# Patient Record
Sex: Female | Born: 1970 | Race: Black or African American | Hispanic: No | Marital: Married | Smoking: Never smoker
Health system: Southern US, Community
[De-identification: ages and names within clinical notes are randomized; demographics above are authoritative.]

## PROBLEM LIST (undated history)

## (undated) DIAGNOSIS — F329 Major depressive disorder, single episode, unspecified: Secondary | ICD-10-CM

## (undated) DIAGNOSIS — I639 Cerebral infarction, unspecified: Secondary | ICD-10-CM

## (undated) DIAGNOSIS — M1990 Unspecified osteoarthritis, unspecified site: Secondary | ICD-10-CM

## (undated) DIAGNOSIS — N189 Chronic kidney disease, unspecified: Secondary | ICD-10-CM

## (undated) DIAGNOSIS — Z87442 Personal history of urinary calculi: Secondary | ICD-10-CM

## (undated) DIAGNOSIS — E119 Type 2 diabetes mellitus without complications: Secondary | ICD-10-CM

## (undated) DIAGNOSIS — J939 Pneumothorax, unspecified: Secondary | ICD-10-CM

## (undated) DIAGNOSIS — D649 Anemia, unspecified: Secondary | ICD-10-CM

## (undated) DIAGNOSIS — I10 Essential (primary) hypertension: Secondary | ICD-10-CM

## (undated) DIAGNOSIS — F32A Depression, unspecified: Secondary | ICD-10-CM

## (undated) DIAGNOSIS — R011 Cardiac murmur, unspecified: Secondary | ICD-10-CM

## (undated) HISTORY — PX: KIDNEY STONE SURGERY: SHX686

## (undated) HISTORY — PX: CHEST TUBE INSERTION: SHX231

## (undated) HISTORY — PX: CARDIAC SURGERY: SHX584

## (undated) HISTORY — PX: EYE SURGERY: SHX253

## (undated) HISTORY — PX: HYSTERECTOMY ABDOMINAL WITH SALPINGECTOMY: SHX6725

## (undated) HISTORY — PX: WISDOM TOOTH EXTRACTION: SHX21

## (undated) HISTORY — PX: PERICARDIAL WINDOW: SHX2213

---

## 2015-12-19 ENCOUNTER — Ambulatory Visit (HOSPITAL_COMMUNITY)
Admission: EM | Admit: 2015-12-19 | Discharge: 2015-12-19 | Disposition: A | Discharge status: Nursing Home (Includes ICF) | Payer: BC Managed Care – PPO | Attending: Emergency Medicine | Admitting: Emergency Medicine

## 2015-12-19 ENCOUNTER — Emergency Department (HOSPITAL_COMMUNITY)
Admission: EM | Admit: 2015-12-19 | Discharge: 2015-12-19 | Disposition: A | Discharge status: Home / Self Care | Payer: BC Managed Care – PPO

## 2015-12-19 ENCOUNTER — Encounter (HOSPITAL_COMMUNITY): Payer: Self-pay | Admitting: Emergency Medicine

## 2015-12-19 DIAGNOSIS — M545 Low back pain, unspecified: Secondary | ICD-10-CM

## 2015-12-19 DIAGNOSIS — R319 Hematuria, unspecified: Secondary | ICD-10-CM | POA: Diagnosis not present

## 2015-12-19 HISTORY — DX: Essential (primary) hypertension: I10

## 2015-12-19 HISTORY — DX: Cerebral infarction, unspecified: I63.9

## 2015-12-19 HISTORY — DX: Type 2 diabetes mellitus without complications: E11.9

## 2015-12-19 LAB — POCT URINALYSIS DIP (DEVICE)
Bilirubin Urine: NEGATIVE
GLUCOSE, UA: NEGATIVE mg/dL
KETONES UR: NEGATIVE mg/dL
Nitrite: NEGATIVE
PH: 7.5 (ref 5.0–8.0)
Specific Gravity, Urine: 1.025 (ref 1.005–1.030)
UROBILINOGEN UA: 0.2 mg/dL (ref 0.0–1.0)

## 2015-12-19 LAB — POCT PREGNANCY, URINE: PREG TEST UR: NEGATIVE

## 2015-12-19 MED ORDER — MORPHINE SULFATE (PF) 2 MG/ML IV SOLN
2.0000 mg | Freq: Once | INTRAVENOUS | Status: AC
  Administered 2015-12-19: 2 mg via INTRAMUSCULAR

## 2015-12-19 MED ORDER — KETOROLAC TROMETHAMINE 60 MG/2ML IM SOLN
60.0000 mg | Freq: Once | INTRAMUSCULAR | Status: AC
  Administered 2015-12-19: 60 mg via INTRAMUSCULAR

## 2015-12-19 MED ORDER — MORPHINE SULFATE (PF) 2 MG/ML IV SOLN
INTRAVENOUS | Status: AC
  Filled 2015-12-19: qty 1

## 2015-12-19 MED ORDER — KETOROLAC TROMETHAMINE 60 MG/2ML IM SOLN
INTRAMUSCULAR | Status: AC
  Filled 2015-12-19: qty 2

## 2015-12-19 NOTE — ED Notes (Signed)
Went to get urine specimen, patient stated that she went while in the waiting room before coming back, patient is unable to void at this time

## 2015-12-19 NOTE — ED Triage Notes (Signed)
Pt called for triage X 2 with no answer

## 2015-12-19 NOTE — ED Provider Notes (Addendum)
HPI  SUBJECTIVE:  Felicia Guerrero is a 45 y.o. female who presents with the acute onset of right low back pain starting today. States it is throbbing, constant, and states that she "can't get comfortable". States this started around 1500 today. She reports nausea, vomiting, urinary frequency and odorous urine. She tried a Norco at 1600. There are no other aggravating or alleviating factors. She has not tried anything else for this. She denies fevers, dysuria, urgency, cloudy urine or hematuria. No back swelling. Patient states that she feels like she is able to empty her bladder completely. No abdominal pain, pelvic pain. No vaginal complaints. She has taken an antipyretic in the past 6-8 hours. She states this is identical in quality and location of previous nephrolithiasis. She has a past medical history of obstructive nephrolithiasis status post multiple surgeries with stents, nephrostomy tubes, kidney blood clots. 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 She took her blood pressure medicines today. LMP: Status post hysterectomy. Urology: [facility]one [facility] [place].     Past Medical History:  Diagnosis Date  . Diabetes mellitus without complication ([place])   . Hypertension   . Kidney stones   . Stroke [facility])     Past Surgical History:  Procedure Laterality Date  . HYSTERECTOMY ABDOMINAL WITH SALPINGECTOMY    . KIDNEY STONE SURGERY      History reviewed. No pertinent family history.  Social History  Substance Use Topics  . Smoking status: Never Smoker  . Smokeless tobacco: Never Used  . Alcohol use No    No current facility-administered medications for this encounter.   Current Outpatient Prescriptions:  .  aspirin 81 MG chewable tablet, Chew by mouth daily., Disp: , Rfl:  .  Cod Liver Oil 10 MINIM CAPS, Take by mouth., Disp: , Rfl:  .  ferrous sulfate 325 (65 FE) MG tablet, Take 325 mg by mouth  daily with breakfast., Disp: , Rfl:  .  LISINOPRIL PO, Take by mouth., Disp: , Rfl:  .  MULTIPLE VITAMIN PO, Take by mouth., Disp: , Rfl:  .  NIFEdipine (PROCARDIA) 10 MG capsule, Take 10 mg by mouth 3 (three) times daily., Disp: , Rfl:  .  sitaGLIPtin-metformin (JANUMET) 50-500 MG tablet, Take 1 tablet by mouth 2 (two) times daily with a meal., Disp: , Rfl:   No Known Allergies   ROS  As noted in HPI.   Physical Exam  BP (!) 212/86 (BP Location: Left Arm)   Pulse 75   Temp 98.7 F (37.1 C) (Oral)   Resp 20   SpO2 100%   Constitutional: Well developed, well nourished, Appears to be in moderate painful distress Eyes:  EOMI, conjunctiva normal bilaterally HENT: Normocephalic, atraumatic,mucus membranes moist Respiratory: Normal inspiratory effort Cardiovascular: Normal rate GI: nondistended, soft, nontender. No suprapubic or flank tenderness Back: Positive right CVA tenderness. Multiple surgical scars noted. skin: No rash, skin intact Musculoskeletal: no deformities Neurologic: Alert & oriented x 3, no focal neuro deficits Psychiatric: Speech and behavior appropriate   ED Course   Medications  ketorolac (TORADOL) injection 60 mg (60 mg Intramuscular Given 12/19/15 2000)  morphine 2 MG/ML injection 2 mg (2 mg Intramuscular Given 12/19/15 2000)    Orders Placed This Encounter  Procedures  . POCT urinalysis dip (device)    Standing Status:   Standing    Number of Occurrences:   1  . Pregnancy, urine POC    Standing Status:  Standing    Number of Occurrences:   1    Results for orders placed or performed during the hospital encounter of 12/19/15 (from the past 24 hour(s))  POCT urinalysis dip (device)     Status: Abnormal   Collection Time: 12/19/15  7:17 PM  Result Value Ref Range   Glucose, UA NEGATIVE NEGATIVE mg/dL   Bilirubin Urine NEGATIVE NEGATIVE   Ketones, ur NEGATIVE NEGATIVE mg/dL   Specific Gravity, Urine 1.025 1.005 - 1.030   Hgb urine dipstick  MODERATE (A) NEGATIVE   pH 7.5 5.0 - 8.0   Protein, ur >=300 (A) NEGATIVE mg/dL   Urobilinogen, UA 0.2 0.0 - 1.0 mg/dL   Nitrite NEGATIVE NEGATIVE   Leukocytes, UA TRACE (A) NEGATIVE  Pregnancy, urine POC     Status: None   Collection Time: 12/19/15  7:23 PM  Result Value Ref Range   Preg Test, Ur NEGATIVE NEGATIVE   No results found.  ED Clinical Impression  Hematuria, unspecified type  Acute right-sided low back pain without sciatica  ED Assessment/Plan  Presentation most consistent with recurrent kidney stone. UA with moderate hematuria and proteinuria. She appears to be in moderate painful distress, gave Toradol 60 mg IM and morphine 2 mg IM. Patient took a Norco at 1600. She declined an antiemetic. States that this usually makes her worse. Plan to send down to the ED to rule out a recurrent obstructing stone given the fact that she's had multiple history of obstructing stones with subsequent complications.   Patient's blood pressure is noted, most likely is due to the pain. She denies chest pain shortness of breath or strokelike symptoms. Doubt hypertensive emergency.   Feel that she is stable to go via shuttle. Discussed rationale for transfer with patient. She agrees with plan.   Meds ordered this encounter  Medications  . LISINOPRIL PO    Sig: Take by mouth.  Marland Kitchen NIFEdipine (PROCARDIA) 10 MG capsule    Sig: Take 10 mg by mouth 3 (three) times daily.  . sitaGLIPtin-metformin (JANUMET) 50-500 MG tablet    Sig: Take 1 tablet by mouth 2 (two) times daily with a meal.  . ferrous sulfate 325 (65 FE) MG tablet    Sig: Take 325 mg by mouth daily with breakfast.  . aspirin 81 MG chewable tablet    Sig: Chew by mouth daily.  . MULTIPLE VITAMIN PO    Sig: Take by mouth.  Marland Kitchen Cod Liver Oil 10 MINIM CAPS    Sig: Take by mouth.  Marland Kitchen ketorolac (TORADOL) injection 60 mg  . morphine 2 MG/ML injection 2 mg    *This clinic note was created using [employer]. Therefore,  there may be occasional mistakes despite careful proofreading.  ?   Melynda Ripple, MD 12/19/15 [place], MD 12/19/15 2100

## 2015-12-19 NOTE — Discharge Instructions (Signed)
Go immediately to the emergency room. Let them know if you start having chest pain, severe headache, visual changes, any other strokelike symptoms, or worsening of your back pain.

## 2015-12-19 NOTE — ED Triage Notes (Signed)
The patient presented to the [facility] with a complaint of right side flank pain that started today. The patient also reported urinary frequency. The patient has an extensive hx of kidney stones. The patient stated that she contacted her Urologist who advised her to come to an Urgent [place] if the pain increased.

## 2017-02-15 ENCOUNTER — Encounter (HOSPITAL_COMMUNITY): Payer: Self-pay | Admitting: Emergency Medicine

## 2017-02-15 ENCOUNTER — Encounter (HOSPITAL_COMMUNITY)
Admission: EM | Disposition: A | Discharge status: Home / Self Care | Payer: Self-pay | Source: Home / Self Care | Attending: Urology

## 2017-02-15 ENCOUNTER — Emergency Department (HOSPITAL_COMMUNITY): Payer: Medicaid Other | Admitting: Registered Nurse

## 2017-02-15 ENCOUNTER — Emergency Department (HOSPITAL_COMMUNITY): Payer: Medicaid Other

## 2017-02-15 ENCOUNTER — Inpatient Hospital Stay (HOSPITAL_COMMUNITY): Payer: Medicaid Other

## 2017-02-15 ENCOUNTER — Inpatient Hospital Stay (HOSPITAL_COMMUNITY)
Admission: EM | Admit: 2017-02-15 | Discharge: 2017-02-17 | DRG: 854 | Disposition: A | Discharge status: Home / Self Care | Payer: Medicaid Other | Attending: Urology | Admitting: Urology

## 2017-02-15 DIAGNOSIS — E1122 Type 2 diabetes mellitus with diabetic chronic kidney disease: Secondary | ICD-10-CM | POA: Diagnosis present

## 2017-02-15 DIAGNOSIS — A4151 Sepsis due to Escherichia coli [E. coli]: Secondary | ICD-10-CM | POA: Diagnosis present

## 2017-02-15 DIAGNOSIS — N12 Tubulo-interstitial nephritis, not specified as acute or chronic: Secondary | ICD-10-CM | POA: Diagnosis present

## 2017-02-15 DIAGNOSIS — N179 Acute kidney failure, unspecified: Secondary | ICD-10-CM | POA: Diagnosis present

## 2017-02-15 DIAGNOSIS — Z7982 Long term (current) use of aspirin: Secondary | ICD-10-CM | POA: Diagnosis not present

## 2017-02-15 DIAGNOSIS — I129 Hypertensive chronic kidney disease with stage 1 through stage 4 chronic kidney disease, or unspecified chronic kidney disease: Secondary | ICD-10-CM | POA: Diagnosis present

## 2017-02-15 DIAGNOSIS — Z7984 Long term (current) use of oral hypoglycemic drugs: Secondary | ICD-10-CM

## 2017-02-15 DIAGNOSIS — R6520 Severe sepsis without septic shock: Secondary | ICD-10-CM | POA: Diagnosis present

## 2017-02-15 DIAGNOSIS — N289 Disorder of kidney and ureter, unspecified: Secondary | ICD-10-CM

## 2017-02-15 DIAGNOSIS — N136 Pyonephrosis: Secondary | ICD-10-CM | POA: Diagnosis present

## 2017-02-15 DIAGNOSIS — N201 Calculus of ureter: Secondary | ICD-10-CM | POA: Diagnosis present

## 2017-02-15 DIAGNOSIS — Z87442 Personal history of urinary calculi: Secondary | ICD-10-CM | POA: Diagnosis not present

## 2017-02-15 DIAGNOSIS — I69398 Other sequelae of cerebral infarction: Secondary | ICD-10-CM | POA: Diagnosis not present

## 2017-02-15 DIAGNOSIS — N189 Chronic kidney disease, unspecified: Secondary | ICD-10-CM | POA: Diagnosis present

## 2017-02-15 DIAGNOSIS — A419 Sepsis, unspecified organism: Secondary | ICD-10-CM

## 2017-02-15 HISTORY — PX: CYSTOSCOPY W/ URETERAL STENT PLACEMENT: SHX1429

## 2017-02-15 LAB — URINALYSIS, ROUTINE W REFLEX MICROSCOPIC
Bilirubin Urine: NEGATIVE
GLUCOSE, UA: 50 mg/dL — AB
KETONES UR: 5 mg/dL — AB
NITRITE: NEGATIVE
PH: 5 (ref 5.0–8.0)
Specific Gravity, Urine: 1.029 (ref 1.005–1.030)

## 2017-02-15 LAB — I-STAT BETA HCG BLOOD, ED (MC, WL, AP ONLY): I-stat hCG, quantitative: <5 m[IU]/mL (ref <5)

## 2017-02-15 LAB — LACTIC ACID, PLASMA
LACTIC ACID, VENOUS: 4.4 mmol/L — AB (ref 0.5–1.9)
LACTIC ACID, VENOUS: 3.2 mmol/L — AB (ref 0.5–1.9)

## 2017-02-15 LAB — CBC
HEMATOCRIT: 33.4 % — AB (ref 36.0–46.0)
HEMOGLOBIN: 11.4 g/dL — AB (ref 12.0–15.0)
MCH: 29.4 pg (ref 26.0–34.0)
MCHC: 34.1 g/dL (ref 30.0–36.0)
MCV: 86.1 fL (ref 78.0–100.0)
Platelets: 238 10*3/uL (ref 150–400)
RBC: 3.88 MIL/uL (ref 3.87–5.11)
RDW: 13.3 % (ref 11.5–15.5)
WBC: 19.7 10*3/uL — AB (ref 4.0–10.5)

## 2017-02-15 LAB — COMPREHENSIVE METABOLIC PANEL
ALBUMIN: 3 g/dL — AB (ref 3.5–5.0)
ALT: 14 U/L (ref 14–54)
AST: 23 U/L (ref 15–41)
Alkaline Phosphatase: 63 U/L (ref 38–126)
Anion gap: 15 (ref 5–15)
BUN: 30 mg/dL — AB (ref 6–20)
CHLORIDE: 95 mmol/L — AB (ref 101–111)
CO2: 22 mmol/L (ref 22–32)
Calcium: 8.3 mg/dL — ABNORMAL LOW (ref 8.9–10.3)
Creatinine, Ser: 2.64 mg/dL — ABNORMAL HIGH (ref 0.44–1.00)
GFR calc Af Amer: 24 mL/min — ABNORMAL LOW (ref >60)
GFR, EST NON AFRICAN AMERICAN: 20 mL/min — AB (ref >60)
Glucose, Bld: 295 mg/dL — ABNORMAL HIGH (ref 65–99)
POTASSIUM: 3.7 mmol/L (ref 3.5–5.1)
Sodium: 132 mmol/L — ABNORMAL LOW (ref 135–145)
Total Bilirubin: 1 mg/dL (ref 0.3–1.2)
Total Protein: 6.5 g/dL (ref 6.5–8.1)

## 2017-02-15 LAB — GLUCOSE, CAPILLARY
GLUCOSE-CAPILLARY: 175 mg/dL — AB (ref 65–99)
Glucose-Capillary: 190 mg/dL — ABNORMAL HIGH (ref 65–99)

## 2017-02-15 LAB — I-STAT CG4 LACTIC ACID, ED
LACTIC ACID, VENOUS: 2.88 mmol/L — AB (ref 0.5–1.9)
Lactic Acid, Venous: 2.17 mmol/L (ref 0.5–1.9)

## 2017-02-15 LAB — LIPASE, BLOOD: LIPASE: 21 U/L (ref 11–51)

## 2017-02-15 SURGERY — CYSTOSCOPY, WITH RETROGRADE PYELOGRAM AND URETERAL STENT INSERTION
Anesthesia: General | Site: Ureter | Laterality: Left

## 2017-02-15 MED ORDER — SODIUM CHLORIDE 0.9 % IR SOLN
Status: DC | PRN
  Administered 2017-02-15: 3000 mL

## 2017-02-15 MED ORDER — FENTANYL CITRATE (PF) 100 MCG/2ML IJ SOLN
INTRAMUSCULAR | Status: DC | PRN
  Administered 2017-02-15: 100 ug via INTRAVENOUS
  Administered 2017-02-15: 50 ug via INTRAVENOUS

## 2017-02-15 MED ORDER — NIFEDIPINE ER OSMOTIC RELEASE 30 MG PO TB24
90.0000 mg | ORAL_TABLET | Freq: Every day | ORAL | Status: DC
  Administered 2017-02-16: 90 mg via ORAL
  Filled 2017-02-15 (×5): qty 1

## 2017-02-15 MED ORDER — CEFTRIAXONE SODIUM 2 G IJ SOLR
2.0000 g | Freq: Once | INTRAMUSCULAR | Status: AC
  Administered 2017-02-15: 2 g via INTRAVENOUS
  Filled 2017-02-15: qty 2

## 2017-02-15 MED ORDER — HYDROXYZINE HCL 25 MG PO TABS: 25.0000 mg | ORAL_TABLET | Freq: Every day | ORAL | Status: DC | PRN

## 2017-02-15 MED ORDER — HYDROMORPHONE HCL 1 MG/ML IJ SOLN: 0.2500 mg | INTRAMUSCULAR | Status: DC | PRN

## 2017-02-15 MED ORDER — LINAGLIPTIN 5 MG PO TABS
5.0000 mg | ORAL_TABLET | Freq: Every day | ORAL | Status: DC
  Administered 2017-02-15 – 2017-02-17 (×3): 5 mg via ORAL
  Filled 2017-02-15 (×3): qty 1

## 2017-02-15 MED ORDER — OXYCODONE HCL 5 MG/5ML PO SOLN: 5.0000 mg | Freq: Once | ORAL | Status: DC | PRN

## 2017-02-15 MED ORDER — ENOXAPARIN SODIUM 30 MG/0.3ML ~~LOC~~ SOLN
30.0000 mg | SUBCUTANEOUS | Status: DC
  Administered 2017-02-16: 30 mg via SUBCUTANEOUS
  Filled 2017-02-15: qty 0.3

## 2017-02-15 MED ORDER — ASPIRIN 81 MG PO CHEW
81.0000 mg | CHEWABLE_TABLET | Freq: Every day | ORAL | Status: DC
  Administered 2017-02-15 – 2017-02-16 (×2): 81 mg via ORAL
  Filled 2017-02-15 (×3): qty 1

## 2017-02-15 MED ORDER — OXYCODONE-ACETAMINOPHEN 5-325 MG PO TABS: 1.0000 | ORAL_TABLET | ORAL | Status: DC | PRN

## 2017-02-15 MED ORDER — MIDAZOLAM HCL 2 MG/2ML IJ SOLN
INTRAMUSCULAR | Status: AC
  Filled 2017-02-15: qty 2

## 2017-02-15 MED ORDER — INSULIN ASPART 100 UNIT/ML ~~LOC~~ SOLN
SUBCUTANEOUS | Status: AC
Start: 2017-02-15 — End: 2017-02-15
  Administered 2017-02-15: 3 [IU] via SUBCUTANEOUS
  Filled 2017-02-15: qty 1

## 2017-02-15 MED ORDER — SODIUM CHLORIDE 0.9 % IV BOLUS (SEPSIS)
1000.0000 mL | Freq: Once | INTRAVENOUS | Status: AC
  Administered 2017-02-15: 1000 mL via INTRAVENOUS

## 2017-02-15 MED ORDER — ONDANSETRON HCL 4 MG/2ML IJ SOLN: 4.0000 mg | INTRAMUSCULAR | Status: DC | PRN

## 2017-02-15 MED ORDER — LACTATED RINGERS IV SOLN
INTRAVENOUS | Status: DC | PRN
  Administered 2017-02-15 (×2): via INTRAVENOUS

## 2017-02-15 MED ORDER — OXYCODONE HCL 5 MG PO TABS: 5.0000 mg | ORAL_TABLET | Freq: Once | ORAL | Status: DC | PRN

## 2017-02-15 MED ORDER — ONDANSETRON HCL 4 MG/2ML IJ SOLN: 4.0000 mg | Freq: Once | INTRAMUSCULAR | Status: DC | PRN

## 2017-02-15 MED ORDER — DIPHENHYDRAMINE HCL 50 MG/ML IJ SOLN: 12.5000 mg | Freq: Four times a day (QID) | INTRAMUSCULAR | Status: DC | PRN

## 2017-02-15 MED ORDER — SUFENTANIL CITRATE 50 MCG/ML IV SOLN: INTRAVENOUS | Status: DC | PRN

## 2017-02-15 MED ORDER — HYDROMORPHONE HCL 1 MG/ML IJ SOLN: 0.5000 mg | INTRAMUSCULAR | Status: DC | PRN

## 2017-02-15 MED ORDER — DEXTROSE 5 % IV SOLN
INTRAVENOUS | Status: AC
  Filled 2017-02-15: qty 10

## 2017-02-15 MED ORDER — LIDOCAINE HCL (CARDIAC) 20 MG/ML IV SOLN
INTRAVENOUS | Status: DC | PRN
  Administered 2017-02-15: 75 mg via INTRAVENOUS
  Administered 2017-02-15: 25 mg via INTRATRACHEAL
  Administered 2017-02-15: 50 mg via INTRAVENOUS

## 2017-02-15 MED ORDER — DOCUSATE SODIUM 100 MG PO CAPS
100.0000 mg | ORAL_CAPSULE | Freq: Two times a day (BID) | ORAL | Status: DC
  Administered 2017-02-15 – 2017-02-16 (×2): 100 mg via ORAL
  Filled 2017-02-15 (×3): qty 1

## 2017-02-15 MED ORDER — ONDANSETRON HCL 4 MG/2ML IJ SOLN
4.0000 mg | Freq: Once | INTRAMUSCULAR | Status: AC
  Administered 2017-02-15: 4 mg via INTRAVENOUS
  Filled 2017-02-15: qty 2

## 2017-02-15 MED ORDER — ONDANSETRON HCL 4 MG/2ML IJ SOLN
INTRAMUSCULAR | Status: DC | PRN
  Administered 2017-02-15: 4 mg via INTRAVENOUS

## 2017-02-15 MED ORDER — SITAGLIP PHOS-METFORMIN HCL ER 50-1000 MG PO TB24: 1.0000 | ORAL_TABLET | Freq: Every day | ORAL | Status: DC

## 2017-02-15 MED ORDER — CEFTRIAXONE SODIUM 1 G IJ SOLR
INTRAMUSCULAR | Status: AC
  Filled 2017-02-15: qty 10

## 2017-02-15 MED ORDER — FENTANYL CITRATE (PF) 250 MCG/5ML IJ SOLN
INTRAMUSCULAR | Status: AC
  Filled 2017-02-15: qty 5

## 2017-02-15 MED ORDER — DEXTROSE 5 % IV SOLN
1.0000 g | INTRAVENOUS | Status: DC
  Administered 2017-02-15: 1 g via INTRAVENOUS

## 2017-02-15 MED ORDER — DIPHENHYDRAMINE HCL 12.5 MG/5ML PO ELIX: 12.5000 mg | ORAL_SOLUTION | Freq: Four times a day (QID) | ORAL | Status: DC | PRN

## 2017-02-15 MED ORDER — ACETAMINOPHEN 325 MG PO TABS
650.0000 mg | ORAL_TABLET | ORAL | Status: DC | PRN
  Administered 2017-02-16: 650 mg via ORAL
  Filled 2017-02-15: qty 2

## 2017-02-15 MED ORDER — ACETAMINOPHEN 325 MG PO TABS
650.0000 mg | ORAL_TABLET | Freq: Once | ORAL | Status: AC
  Administered 2017-02-15: 650 mg via ORAL
  Filled 2017-02-15: qty 2

## 2017-02-15 MED ORDER — DEXAMETHASONE SODIUM PHOSPHATE 10 MG/ML IJ SOLN
INTRAMUSCULAR | Status: DC | PRN
  Administered 2017-02-15: 10 mg via INTRAVENOUS

## 2017-02-15 MED ORDER — INSULIN ASPART 100 UNIT/ML ~~LOC~~ SOLN
0.0000 [IU] | SUBCUTANEOUS | Status: DC
  Administered 2017-02-15 – 2017-02-16 (×3): 3 [IU] via SUBCUTANEOUS
  Administered 2017-02-16: 2 [IU] via SUBCUTANEOUS
  Administered 2017-02-16 (×2): 3 [IU] via SUBCUTANEOUS
  Administered 2017-02-17 (×2): 2 [IU] via SUBCUTANEOUS

## 2017-02-15 MED ORDER — BELLADONNA ALKALOIDS-OPIUM 16.2-60 MG RE SUPP: 1.0000 | Freq: Four times a day (QID) | RECTAL | Status: DC | PRN

## 2017-02-15 MED ORDER — PHENYLEPHRINE HCL 10 MG/ML IJ SOLN
INTRAVENOUS | Status: DC | PRN
  Administered 2017-02-15: 50 ug/min via INTRAVENOUS

## 2017-02-15 MED ORDER — ONDANSETRON HCL 4 MG/2ML IJ SOLN
INTRAMUSCULAR | Status: AC
  Filled 2017-02-15: qty 2

## 2017-02-15 MED ORDER — PROPOFOL 10 MG/ML IV BOLUS
INTRAVENOUS | Status: AC
  Filled 2017-02-15: qty 20

## 2017-02-15 MED ORDER — MIDAZOLAM HCL 5 MG/5ML IJ SOLN
INTRAMUSCULAR | Status: DC | PRN
  Administered 2017-02-15: 2 mg via INTRAVENOUS

## 2017-02-15 MED ORDER — DEXAMETHASONE SODIUM PHOSPHATE 10 MG/ML IJ SOLN
INTRAMUSCULAR | Status: AC
  Filled 2017-02-15: qty 1

## 2017-02-15 MED ORDER — ZOLPIDEM TARTRATE 5 MG PO TABS: 5.0000 mg | ORAL_TABLET | Freq: Every evening | ORAL | Status: DC | PRN

## 2017-02-15 MED ORDER — SODIUM CHLORIDE 0.9 % IV SOLN
INTRAVENOUS | Status: DC
  Administered 2017-02-15 – 2017-02-17 (×4): via INTRAVENOUS

## 2017-02-15 MED ORDER — MENTHOL 3 MG MT LOZG
1.0000 | LOZENGE | OROMUCOSAL | Status: DC | PRN
  Filled 2017-02-15: qty 9

## 2017-02-15 MED ORDER — ROSUVASTATIN CALCIUM 20 MG PO TABS
20.0000 mg | ORAL_TABLET | Freq: Every evening | ORAL | Status: DC
Start: 2017-02-15 — End: 2017-02-17
  Administered 2017-02-15 – 2017-02-16 (×2): 20 mg via ORAL
  Filled 2017-02-15 (×2): qty 1

## 2017-02-15 MED ORDER — METFORMIN HCL ER 500 MG PO TB24: 1000.0000 mg | ORAL_TABLET | Freq: Every day | ORAL | Status: DC

## 2017-02-15 MED ORDER — LISINOPRIL 20 MG PO TABS
20.0000 mg | ORAL_TABLET | Freq: Two times a day (BID) | ORAL | Status: DC
  Administered 2017-02-15 – 2017-02-17 (×4): 20 mg via ORAL
  Filled 2017-02-15 (×5): qty 1

## 2017-02-15 MED ORDER — PREDNISOLONE ACETATE 1 % OP SUSP
1.0000 [drp] | Freq: Every day | OPHTHALMIC | Status: DC
  Administered 2017-02-16 – 2017-02-17 (×3): 1 [drp] via OPHTHALMIC
  Filled 2017-02-15 (×2): qty 5

## 2017-02-15 MED ORDER — INSULIN ASPART 100 UNIT/ML ~~LOC~~ SOLN
SUBCUTANEOUS | Status: DC | PRN
  Administered 2017-02-15: 5 [IU] via SUBCUTANEOUS

## 2017-02-15 MED ORDER — SODIUM CHLORIDE 0.9 % IV BOLUS (SEPSIS)
250.0000 mL | Freq: Once | INTRAVENOUS | Status: AC
  Administered 2017-02-15: 250 mL via INTRAVENOUS

## 2017-02-15 MED ORDER — HYDROMORPHONE HCL 1 MG/ML IJ SOLN
0.5000 mg | INTRAMUSCULAR | Status: DC | PRN
  Administered 2017-02-15: 0.5 mg via INTRAVENOUS
  Filled 2017-02-15: qty 1

## 2017-02-15 SURGICAL SUPPLY — 18 items
BAG URO CATCHER STRL LF (MISCELLANEOUS) ×2 IMPLANT
BASKET ZERO TIP NITINOL 2.4FR (BASKET) IMPLANT
CATH INTERMIT  6FR 70CM (CATHETERS) IMPLANT
CLOTH BEACON ORANGE TIMEOUT ST (SAFETY) ×2 IMPLANT
COVER FOOTSWITCH UNIV (MISCELLANEOUS) IMPLANT
COVER SURGICAL LIGHT HANDLE (MISCELLANEOUS) ×2 IMPLANT
FIBER LASER FLEXIVA 365 (UROLOGICAL SUPPLIES) IMPLANT
FIBER LASER TRAC TIP (UROLOGICAL SUPPLIES) IMPLANT
GLOVE BIOGEL M STRL SZ7.5 (GLOVE) ×2 IMPLANT
GOWN STRL REUS W/TWL LRG LVL3 (GOWN DISPOSABLE) ×4 IMPLANT
GUIDEWIRE ANG ZIPWIRE 038X150 (WIRE) IMPLANT
GUIDEWIRE STR DUAL SENSOR (WIRE) ×2 IMPLANT
IV NS 1000ML (IV SOLUTION) ×1
IV NS 1000ML BAXH (IV SOLUTION) ×1 IMPLANT
MANIFOLD NEPTUNE II (INSTRUMENTS) ×2 IMPLANT
PACK CYSTO (CUSTOM PROCEDURE TRAY) ×2 IMPLANT
SHEATH URETERAL 12FRX35CM (MISCELLANEOUS) IMPLANT
TUBING CONNECTING 10 (TUBING) ×2 IMPLANT

## 2017-02-15 NOTE — Consult Note (Signed)
Urology Consult   Physician requesting consult: Dr. Tanna Furry  Reason for consult: Left ureteral stone and fever  History of Present Illness: Felicia Guerrero is a 46 y.o. with a long history of recurrent stone previously treated multiple times in [place].  She developed the acute onset of severe left flank and abdominal pain 3 days ago with associated nausea and vomiting.  She developed chills and was noted to be febrile over 101 upon arrival to the ED today.  CT imaging revealed an 8 mm proximal left ureteral stone and other bilateral non-obstructing stones.  Her lactic acid is mildly elevated but she has been hemodynamically stable.  She has previously undergone what sounds like ureteroscopic procedures, PCNL, and possibly ESWL.  Her stroke occurred after one of her prior stone procedures.  She woke up with left sided hemiparesis which mostly resolved although she still has some deficit with motor function of her left side.  Her evaluation (all performed in [place]) apparently did not reveal an underlying etiology or risk factor for her stroke.  She does take ASA 81 mg.  She has a history of CKD due to DM and HTN and is followed by nephrology in [place].  Her baseline Cr is unknown.   Past Medical History:  Diagnosis Date  . Diabetes mellitus without complication ([place])   . Hypertension   . Kidney stones   . Stroke [facility])   CKD   Past Surgical History:  Procedure Laterality Date  . HYSTERECTOMY ABDOMINAL WITH SALPINGECTOMY    . [facility] Medications:  Home meds:  No outpatient medications have been marked as taking for the 02/15/17 encounter [facility] Encounter).    Scheduled Meds: Continuous Infusions: . cefTRIAXone (ROCEPHIN)  IV    . sodium chloride 1,000 mL (02/15/17 1447)   And  . sodium chloride     And  . sodium chloride     PRN Meds:.HYDROmorphone (DILAUDID) injection  Allergies: No Known Allergies  No family history  on file.  Social History:  reports that  has never smoked. she has never used smokeless tobacco. She reports that she does not drink alcohol or use drugs.  ROS: A complete review of systems was performed.  All systems are negative except for pertinent findings as noted.  Physical Exam:  Vital signs in last 24 hours: Temp:  [100.3 F (37.9 C)] 100.3 F (37.9 C) (12/23 1239) Pulse Rate:  [109-137] 109 (12/23 1400) Resp:  [18] 18 (12/23 1239) BP: (104-123)/(60-70) 104/70 (12/23 1400) SpO2:  [98 %-99 %] 99 % (12/23 1400) Weight:  [67.6 kg (149 lb)] 67.6 kg (149 lb) (12/23 1447) Constitutional:  Alert and oriented, No acute distress Cardiovascular: Mildly tachycardic, No JVD Respiratory: Normal respiratory effort, Lungs clear bilaterally  GI: Abdomen is soft, nontender, nondistended, no abdominal masses GU: Mild left CVA tenderness Lymphatic: No lymphadenopathy Neurologic: Grossly intact, she has decreased strength in her left upper extremity related to her prior stroke Psychiatric: Normal mood and affect  Laboratory Data:  Recent Labs    02/15/17 1256  WBC 19.7*  HGB 11.4*  HCT 33.4*  PLT 238    Recent Labs    02/15/17 1256  NA 132*  K 3.7  CL 95*  GLUCOSE 295*  BUN 30*  CALCIUM 8.3*  CREATININE 2.64*     Results for orders placed or performed during the hospital encounter of 02/15/17 (from the past 24 hour(s))  Lipase, blood  Status: None   Collection Time: 02/15/17 12:56 PM  Result Value Ref Range   Lipase 21 11 - 51 U/L  Comprehensive metabolic panel     Status: Abnormal   Collection Time: 02/15/17 12:56 PM  Result Value Ref Range   Sodium 132 (L) 135 - 145 mmol/L   Potassium 3.7 3.5 - 5.1 mmol/L   Chloride 95 (L) 101 - 111 mmol/L   CO2 22 22 - 32 mmol/L   Glucose, Bld 295 (H) 65 - 99 mg/dL   BUN 30 (H) 6 - 20 mg/dL   Creatinine, Ser 2.64 (H) 0.44 - 1.00 mg/dL   Calcium 8.3 (L) 8.9 - 10.3 mg/dL   Total Protein 6.5 6.5 - 8.1 g/dL   Albumin 3.0 (L)  3.5 - 5.0 g/dL   AST 23 15 - 41 U/L   ALT 14 14 - 54 U/L   Alkaline Phosphatase 63 38 - 126 U/L   Total Bilirubin 1.0 0.3 - 1.2 mg/dL   GFR calc non Af Amer 20 (L) >60 mL/min   GFR calc Af Amer 24 (L) >60 mL/min   Anion gap 15 5 - 15  CBC     Status: Abnormal   Collection Time: 02/15/17 12:56 PM  Result Value Ref Range   WBC 19.7 (H) 4.0 - 10.5 K/uL   RBC 3.88 3.87 - 5.11 MIL/uL   Hemoglobin 11.4 (L) 12.0 - 15.0 g/dL   HCT 33.4 (L) 36.0 - 46.0 %   MCV 86.1 78.0 - 100.0 fL   MCH 29.4 26.0 - 34.0 pg   MCHC 34.1 30.0 - 36.0 g/dL   RDW 13.3 11.5 - 15.5 %   Platelets 238 150 - 400 K/uL  I-Stat beta hCG blood, ED     Status: None   Collection Time: 02/15/17  1:08 PM  Result Value Ref Range   I-stat hCG, quantitative <5.0 <5 mIU/mL   Comment 3          I-Stat CG4 Lactic Acid, ED     Status: Abnormal   Collection Time: 02/15/17  1:48 PM  Result Value Ref Range   Lactic Acid, Venous 2.17 (HH) 0.5 - 1.9 mmol/L   Comment NOTIFIED PHYSICIAN    No results found for this or any previous visit (from the past 240 hour(s)).  Renal Function: Recent Labs    02/15/17 1256  CREATININE 2.64*   Estimated Creatinine Clearance: 24.4 mL/min (A) (by C-G formula based on SCr of 2.64 mg/dL (H)).  Radiologic Imaging: Ct Renal Stone Study  Result Date: 02/15/2017 CLINICAL DATA:  Left kidney pain.  Vomiting and feeling hot. EXAM: CT ABDOMEN AND PELVIS WITHOUT CONTRAST TECHNIQUE: Multidetector CT imaging of the abdomen and pelvis was performed following the standard protocol without IV contrast. COMPARISON:  None. FINDINGS: Lower chest: Lingular airspace disease likely reflecting atelectasis. Trace left pleural effusion. Hepatobiliary: No focal liver abnormality is seen. No gallstones, gallbladder wall thickening, or biliary dilatation. Pancreas: Unremarkable. No pancreatic ductal dilatation or surrounding inflammatory changes. Spleen: Normal in size without focal abnormality. Adrenals/Urinary Tract:  Adrenal glands are normal punctate nonobstructing right renal calculus. Right kidney is otherwise normal. Multiple nonobstructing left renal calculi with the largest measuring 7 mm in the midpole. 8 mm proximal left ureteral calculus resulting and moderate left hydronephrosis and mild perinephric stranding. Bladder is normal. Stomach/Bowel: No bowel wall thickening or bowel dilatation. No pneumatosis, pneumoperitoneum or portal venous gas. Normal appendix. No bowel obstruction. Vascular/Lymphatic: Abdominal aortic atherosclerosis. Normal caliber abdominal aorta. Reproductive:  Status post hysterectomy. No adnexal masses. Other: Small amount pelvic free fluid. Musculoskeletal: No acute osseous abnormality. No aggressive osseous lesion. IMPRESSION: 1. 8 mm proximal left ureteral calculus resulting and moderate left hydronephrosis and mild perinephric stranding. 2. Bilateral nonobstructing renal calculi. 3. Trace left pleural effusion. 4.  Aortic Atherosclerosis (ICD10-170.0) Electronically Signed   By: Kathreen Devoid   On: 02/15/2017 14:06    I independently reviewed the above imaging studies.  Impression/Recommendation: Left ureteral stone and developing sepsis: I have recommended that she proceed with urgent cystoscopy and left ureteral stent placement.  She has been started on ceftriaxone and urine cultures have been obtained.  She will continue aggressive fluid hydration and her lactic acid will be monitored. I discussed the potential benefits and risks of the procedure, side effects of the proposed treatment, the likelihood of the patient achieving the goals of the procedure, and any potential problems that might occur during the procedure or recuperation.  She gives informed consent to proceed.  Catcher Dehoyos,LES 02/15/2017, 2:51 PM  Pryor Curia. MD   CC: Dr. Tanna Furry

## 2017-02-15 NOTE — ED Notes (Signed)
Note: she is taken to O.R. At this time. Her significant other takes ALL of her belongings home with him.

## 2017-02-15 NOTE — Transfer of Care (Addendum)
Immediate Anesthesia Transfer of Care Note  Patient: Felicia Guerrero  Procedure(s) Performed: CYSTOSCOPY WITH LEFT URETERAL STENT PLACEMENT (Left Ureter)  Patient Location: PACU  Anesthesia Type:General  Level of Consciousness: awake, alert , oriented and patient cooperative  Airway & Oxygen Therapy: Patient Spontanous Breathing and Patient connected to face mask oxygen  Post-op Assessment: Report given to RN, Post -op Vital signs reviewed and stable and Patient moving all extremities X 4  Post vital signs: stable  Last Vitals:  Vitals:   02/15/17 1810 02/15/17 1815  BP: (!) 103/59 100/61  Pulse: (!) 102 (!) 101  Resp: 18 (!) 22  Temp: 36.9 C   SpO2: 98% 100%    Last Pain:  Vitals:   02/15/17 1550  TempSrc: Oral  PainSc: 2       Patients Stated Pain Goal: 2 (51/83/35 8251)  Complications: No apparent anesthesia complications  Weakness on left side as before

## 2017-02-15 NOTE — Progress Notes (Signed)
CRITICAL VALUE ALERT  Critical Value:  Lactic acid 3.2  Date & Time Notied:  02/15/2017 2225  Provider Notified: Clint Lipps, MD

## 2017-02-15 NOTE — Anesthesia Procedure Notes (Signed)
Procedure Name: Intubation Date/Time: 02/15/2017 5:37 PM Performed by: Lissa Morales, CRNA Pre-anesthesia Checklist: Patient identified, Emergency Drugs available, Suction available and Patient being monitored Patient Re-evaluated:Patient Re-evaluated prior to induction Oxygen Delivery Method: Circle system utilized Preoxygenation: Pre-oxygenation with 100% oxygen Induction Type: IV induction Ventilation: Mask ventilation without difficulty Laryngoscope Size: Mac and 4 Grade View: Grade II Tube type: Oral Tube size: 7.5 mm Number of attempts: 1 Airway Equipment and Method: Stylet and Oral airway Placement Confirmation: ETT inserted through vocal cords under direct vision,  positive ETCO2 and breath sounds checked- equal and bilateral Secured at: 21 cm Tube secured with: Tape Dental Injury: Teeth and Oropharynx as per pre-operative assessment

## 2017-02-15 NOTE — Op Note (Signed)
Preoperative diagnosis:  1. Left ureteral stone and fever   Postoperative diagnosis:  1. Left ureteral stone and fever   Procedure:  1. Cystoscopy 2. Left ureteral stent placement (6 x 24 with no string)  Surgeon: Roxy Horseman, Brooke Bonito. M.D.  Anesthesia: General  Complications: None  EBL: Minimal  Specimens: None  Indication: Felicia Guerrero is a 46 y.o. patient with an 8 mm obstructing left ureteral stone and fever. After reviewing the management options for treatment, he elected to proceed with the above surgical procedure(s). We have discussed the potential benefits and risks of the procedure, side effects of the proposed treatment, the likelihood of the patient achieving the goals of the procedure, and any potential problems that might occur during the procedure or recuperation. Informed consent has been obtained.  Description of procedure:  The patient was taken to the operating room and general anesthesia was induced.  The patient was placed in the dorsal lithotomy position, prepped and draped in the usual sterile fashion, and preoperative antibiotics were administered. A preoperative time-out was performed.   Cystourethroscopy was performed.  The patient's urethra was examined and was normal. The bladder was then systematically examined in its entirety. There was no evidence for any bladder tumors, stones, or other mucosal pathology.    A 0.38 sensor guidewire was then advanced up the left ureter into the renal pelvis under fluoroscopic guidance.  The wire was then backloaded through the cystoscope and a ureteral stent was advance over the wire using Seldinger technique.  The stent was positioned appropriately under fluoroscopic and cystoscopic guidance.  The wire was then removed with an adequate stent curl noted in the renal pelvis as well as in the bladder.  The bladder was then emptied and the procedure ended.  The patient appeared to tolerate the procedure well and without  complications.  The patient was able to be awakened and transferred to the recovery unit in satisfactory condition.    Pryor Curia MD

## 2017-02-15 NOTE — Anesthesia Preprocedure Evaluation (Addendum)
Anesthesia Evaluation  Patient identified by MRN, date of birth, ID band Patient awake    Reviewed: Allergy & Precautions, NPO status , Patient's Chart, lab work & pertinent test results  Airway Mallampati: II  TM Distance: >3 FB Neck ROM: Full    Dental  (+) Teeth Intact, Dental Advisory Given   Pulmonary    breath sounds clear to auscultation       Cardiovascular hypertension,  Rhythm:Regular Rate:Tachycardia     Neuro/Psych    GI/Hepatic   Endo/Other  diabetes  Renal/GU      Musculoskeletal   Abdominal   Peds  Hematology   Anesthesia Other Findings   Reproductive/Obstetrics                            Anesthesia Physical Anesthesia Plan  ASA: III and emergent  Anesthesia Plan: General   Post-op Pain Management:    Induction: Intravenous, Inhalational and Rapid sequence  PONV Risk Score and Plan: Ondansetron, Dexamethasone and Midazolam  Airway Management Planned: Oral ETT  Additional Equipment:   Intra-op Plan:   Post-operative Plan: Extubation in OR  Informed Consent: I have reviewed the patients History and Physical, chart, labs and discussed the procedure including the risks, benefits and alternatives for the proposed anesthesia with the patient or authorized representative who has indicated his/her understanding and acceptance.   Dental advisory given  Plan Discussed with: CRNA and Anesthesiologist  Anesthesia Plan Comments:         Anesthesia Quick Evaluation

## 2017-02-15 NOTE — ED Provider Notes (Deleted)
Pt seen and evalauted. Discussed with PA Georga Kaufmann. Patient with history of multiple myeloma. Previously on treatment. Awaiting reinitiation of treatment. For several days now has had pelvic and femoral pain and abdominal pain and cramps.  Calcium today is still being diluted and lab is greater than 15. No rhythm changes. EKG pending. Reading 1.79. Anemic at 8.9 which is her baseline. I ordered IV fluids. I will order calcitonin. I've ordered zoledronic acid.  Call placed Hospital regarding admission   Tanna Furry, MD 02/15/17 [phone]

## 2017-02-15 NOTE — Progress Notes (Signed)
A consult was received from an ED physician for rocephin per pharmacy dosing.  The patient's profile has been reviewed for ht/wt/allergies/indication/available labs.   A one time order has been placed for 2g.  Further antibiotics/pharmacy consults should be ordered by admitting physician if indicated.                       Thank you, Kara Mead 02/15/2017  2:29 PM

## 2017-02-15 NOTE — ED Notes (Signed)
Lactic given to MD and RN.

## 2017-02-15 NOTE — ED Triage Notes (Signed)
Patient c/o pain in left kidney for while now. Starting vomiting and feeling hot.

## 2017-02-15 NOTE — Progress Notes (Signed)
CRITICAL VALUE ALERT  Critical Value: Lactic Acid 4.4  Date & Time Notied: 02/15/17 2023  Provider Notified:[facility] on-call  Orders Received/Actions taken: On call phoned to get current vital . No new orders.

## 2017-02-15 NOTE — ED Provider Notes (Signed)
[facility] DEPT Provider Note   CSN: [id number] Arrival date & time: 02/15/17  1234     History   Chief Complaint Chief Complaint  Patient presents with  . Flank Pain  . Emesis    HPI Felicia Guerrero is a 46 y.o. female. CC: fever and flank pain  HPI:  46 year old female. History of known stage III renal insufficiency. Non insulin  diabetes. Multiple ureteral stones.  Baseline creatinine 1.4. Falls with nephrology at [facility].  Flank pain on the left last 3 days. Awake this morning of nausea, worsening pain, fever. Presents here for evaluation  Past Medical History:  Diagnosis Date  . Diabetes mellitus without complication ([place])   . Hypertension   . Kidney stones   . Stroke [facility])     There are no active problems to display for this patient.   Past Surgical History:  Procedure Laterality Date  . HYSTERECTOMY ABDOMINAL WITH SALPINGECTOMY    . KIDNEY STONE SURGERY      OB History    No data available       Home Medications    Prior to Admission medications   Medication Sig Start Date End Date Taking? Authorizing Provider  aspirin 81 MG chewable tablet Chew by mouth daily.    [provider]  [facility] Liver Oil 10 MINIM CAPS Take by mouth.    [provider]  ferrous sulfate 325 (65 FE) MG tablet Take 325 mg by mouth daily with breakfast.    [provider]  LISINOPRIL PO Take by mouth.    [provider]  MULTIPLE VITAMIN PO Take by mouth.    [provider]  NIFEdipine (PROCARDIA) 10 MG capsule Take 10 mg by mouth 3 (three) times daily.    [provider]  sitaGLIPtin-metformin (JANUMET) 50-500 MG tablet Take 1 tablet by mouth 2 (two) times daily with a meal.    [provider]    Family History No family history on file.  Social History Social History   Tobacco Use  . Smoking status: Never Smoker  . Smokeless tobacco: Never Used  Substance Use Topics  .  Alcohol use: No  . Drug use: No     Allergies   Patient has no known allergies.   Review of Systems Review of Systems  Constitutional: Positive for diaphoresis and fatigue. Negative for appetite change, chills and fever.  HENT: Negative for mouth sores, sore throat and trouble swallowing.   Eyes: Negative for visual disturbance.  Respiratory: Negative for cough, chest tightness, shortness of breath and wheezing.   Cardiovascular: Negative for chest pain.  Gastrointestinal: Positive for nausea and vomiting. Negative for abdominal distention, abdominal pain and diarrhea.  Endocrine: Negative for polydipsia, polyphagia and polyuria.  Genitourinary: Positive for flank pain and urgency. Negative for dysuria, frequency and hematuria.  Musculoskeletal: Negative for gait problem.  Skin: Negative for color change, pallor and rash.  Neurological: Negative for dizziness, syncope, light-headedness and headaches.  Hematological: Does not bruise/bleed easily.  Psychiatric/Behavioral: Negative for behavioral problems and confusion.     Physical Exam Updated Vital Signs BP 104/70   Pulse (!) 109   Temp 100.3 F (37.9 C) (Oral)   Resp 18   Ht 5' 5.75" (1.67 m)   Wt 67.6 kg (149 lb)   SpO2 99%   BMI 24.23 kg/m   Physical Exam  Constitutional: She is oriented to person, place, and time. No distress.  Appears uncomfortable. Awake alert and oriented. Recheck  oral temp by me 101.7.  HENT:  Head: Normocephalic.  Eyes: Conjunctivae are normal. Pupils are equal, round, and reactive to light. No scleral icterus.  Neck: Normal range of motion. Neck supple. No thyromegaly present.  Cardiovascular: Regular rhythm. Exam reveals no gallop and no friction rub.  No murmur heard. Not hypotensive. Excellent peripheral perfusion. Warm skin. Heart rate 121 sinus on the monitor  Pulmonary/Chest: Effort normal and breath sounds normal. No respiratory distress. She has no wheezes. She has no rales.    Abdominal: Soft. Bowel sounds are normal. She exhibits no distension. There is no tenderness. There is no rebound.  Tenderness and left flank. No peritoneal irritation  Musculoskeletal: Normal range of motion.  Neurological: She is alert and oriented to person, place, and time.  Skin: Skin is warm and dry. No rash noted.  Psychiatric: She has a normal mood and affect. Her behavior is normal.     ED Treatments / Results  Labs (all labs ordered are listed, but only abnormal results are displayed) Labs Reviewed  COMPREHENSIVE METABOLIC PANEL - Abnormal; Notable for the following components:      Result Value   Sodium 132 (*)    Chloride 95 (*)    Glucose, Bld 295 (*)    BUN 30 (*)    Creatinine, Ser 2.64 (*)    Calcium 8.3 (*)    Albumin 3.0 (*)    GFR calc non Af Amer 20 (*)    GFR calc Af Amer 24 (*)    All other components within normal limits  CBC - Abnormal; Notable for the following components:   WBC 19.7 (*)    Hemoglobin 11.4 (*)    HCT 33.4 (*)    All other components within normal limits  I-STAT CG4 LACTIC ACID, ED - Abnormal; Notable for the following components:   Lactic Acid, Venous 2.17 (*)    All other components within normal limits  URINE CULTURE  CULTURE, BLOOD (ROUTINE X 2)  CULTURE, BLOOD (ROUTINE X 2)  LIPASE, BLOOD  URINALYSIS, ROUTINE W REFLEX MICROSCOPIC  I-STAT BETA HCG BLOOD, ED ([facility], [facility], [facility] ONLY)  I-STAT CG4 LACTIC ACID, ED    EKG  EKG Interpretation  Date/Time:  Sunday February 15 2017 14:31:14 EST Ventricular Rate:  109 PR Interval:    QRS Duration: 86 QT Interval:  341 QTC Calculation: 460 R Axis:   83 Text Interpretation:  Sinus tachycardia Baseline wander in lead(s) V2 Confirmed by Tanna Furry [phone]) on 02/15/2017 2:42:45 PM       Radiology Ct Renal Stone Study  Result Date: 02/15/2017 CLINICAL DATA:  Left kidney pain.  Vomiting and feeling hot. EXAM: CT ABDOMEN AND PELVIS WITHOUT CONTRAST TECHNIQUE: Multidetector CT imaging  of the abdomen and pelvis was performed following the standard protocol without IV contrast. COMPARISON:  None. FINDINGS: Lower chest: Lingular airspace disease likely reflecting atelectasis. Trace left pleural effusion. Hepatobiliary: No focal liver abnormality is seen. No gallstones, gallbladder wall thickening, or biliary dilatation. Pancreas: Unremarkable. No pancreatic ductal dilatation or surrounding inflammatory changes. Spleen: Normal in size without focal abnormality. Adrenals/Urinary Tract: Adrenal glands are normal punctate nonobstructing right renal calculus. Right kidney is otherwise normal. Multiple nonobstructing left renal calculi with the largest measuring 7 mm in the midpole. 8 mm proximal left ureteral calculus resulting and moderate left hydronephrosis and mild perinephric stranding. Bladder is normal. Stomach/Bowel: No bowel wall thickening or bowel dilatation. No pneumatosis, pneumoperitoneum or portal venous gas. Normal appendix. No bowel obstruction. Vascular/Lymphatic:  Abdominal aortic atherosclerosis. Normal caliber abdominal aorta. Reproductive: Status post hysterectomy. No adnexal masses. Other: Small amount pelvic free fluid. Musculoskeletal: No acute osseous abnormality. No aggressive osseous lesion. IMPRESSION: 1. 8 mm proximal left ureteral calculus resulting and moderate left hydronephrosis and mild perinephric stranding. 2. Bilateral nonobstructing renal calculi. 3. Trace left pleural effusion. 4.  Aortic Atherosclerosis (ICD10-170.0) Electronically Signed   By: Kathreen Devoid   On: 02/15/2017 14:06    Procedures Procedures (including critical care time)  Medications Ordered in ED Medications  HYDROmorphone (DILAUDID) injection 0.5 mg (0.5 mg Intravenous Given 02/15/17 1345)  sodium chloride 0.9 % bolus 1,000 mL (1,000 mLs Intravenous New Bag/Given 02/15/17 1447)    And  sodium chloride 0.9 % bolus 1,000 mL (not administered)    And  sodium chloride 0.9 % bolus 250 mL  (not administered)  cefTRIAXone (ROCEPHIN) 2 g in dextrose 5 % 50 mL IVPB (not administered)  sodium chloride 0.9 % bolus 1,000 mL (0 mLs Intravenous Stopped 02/15/17 1445)  ondansetron (ZOFRAN) injection 4 mg (4 mg Intravenous Given 02/15/17 1345)  acetaminophen (TYLENOL) tablet 650 mg (650 mg Oral Given 02/15/17 1345)     Initial Impression / Assessment and Plan / ED Course  I have reviewed the triage vital signs and the nursing notes.  Pertinent labs & imaging results that were available during my care of the patient were reviewed by me and considered in my medical decision making (see chart for details).   febrile, tachycardic, elevated lactate. Code sepsis initiated. No urine from her as yet. Given IV fluids. Blood cultures obtained. Rocephin ordered. We'll await urine sample and administer Rocephin. Leukocytosis at 19,000. CT scan shows 1.8 cm proximal left ureteral stone with hydronephrosis and perinephric stranding.  I discussed the case with Dr. Alinda Money. He is involved in an operative case at [facility]. He states upon completion of that he will have directly here for planned stenting.  CRITICAL CARE Performed by: Lolita Patella   Total critical care time: 30 minutes  Critical care time was exclusive of separately billable procedures and treating other patients.  Critical care was necessary to treat or prevent imminent or life-threatening deterioration.  Critical care was time spent personally by me on the following activities: development of treatment plan with patient and/or surrogate as well as nursing, discussions with consultants, evaluation of patient's response to treatment, examination of patient, obtaining history from patient or surrogate, ordering and performing treatments and interventions, ordering and review of laboratory studies, ordering and review of radiographic studies, pulse oximetry and re-evaluation of patient's condition.   Final Clinical Impressions(s)  / ED Diagnoses   Final diagnoses:  Pyelonephritis  Sepsis, due to unspecified organism [facility])    ED Discharge Orders    None       Tanna Furry, MD 02/15/17 [phone]

## 2017-02-16 ENCOUNTER — Other Ambulatory Visit: Payer: Self-pay

## 2017-02-16 LAB — BLOOD CULTURE ID PANEL (REFLEXED)
ACINETOBACTER BAUMANNII: NOT DETECTED
CANDIDA ALBICANS: NOT DETECTED
CANDIDA GLABRATA: NOT DETECTED
CANDIDA KRUSEI: NOT DETECTED
CANDIDA PARAPSILOSIS: NOT DETECTED
CANDIDA TROPICALIS: NOT DETECTED
Carbapenem resistance: NOT DETECTED
ENTEROBACTER CLOACAE COMPLEX: NOT DETECTED
ESCHERICHIA COLI: DETECTED — AB
Enterobacteriaceae species: DETECTED — AB
Enterococcus species: NOT DETECTED
Haemophilus influenzae: NOT DETECTED
KLEBSIELLA OXYTOCA: NOT DETECTED
KLEBSIELLA PNEUMONIAE: NOT DETECTED
Listeria monocytogenes: NOT DETECTED
Neisseria meningitidis: NOT DETECTED
PROTEUS SPECIES: NOT DETECTED
Pseudomonas aeruginosa: NOT DETECTED
STREPTOCOCCUS AGALACTIAE: NOT DETECTED
Serratia marcescens: NOT DETECTED
Staphylococcus aureus (BCID): NOT DETECTED
Staphylococcus species: NOT DETECTED
Streptococcus pneumoniae: NOT DETECTED
Streptococcus pyogenes: NOT DETECTED
Streptococcus species: NOT DETECTED

## 2017-02-16 LAB — CBC
HCT: 32.5 % — ABNORMAL LOW (ref 36.0–46.0)
Hemoglobin: 10.8 g/dL — ABNORMAL LOW (ref 12.0–15.0)
MCH: 29 pg (ref 26.0–34.0)
MCHC: 33.2 g/dL (ref 30.0–36.0)
MCV: 87.4 fL (ref 78.0–100.0)
PLATELETS: 182 10*3/uL (ref 150–400)
RBC: 3.72 MIL/uL — AB (ref 3.87–5.11)
RDW: 13.9 % (ref 11.5–15.5)
WBC: 22.7 10*3/uL — ABNORMAL HIGH (ref 4.0–10.5)

## 2017-02-16 LAB — GLUCOSE, CAPILLARY
GLUCOSE-CAPILLARY: 159 mg/dL — AB (ref 65–99)
GLUCOSE-CAPILLARY: 139 mg/dL — AB (ref 65–99)
GLUCOSE-CAPILLARY: 100 mg/dL — AB (ref 65–99)
Glucose-Capillary: 196 mg/dL — ABNORMAL HIGH (ref 65–99)
Glucose-Capillary: 157 mg/dL — ABNORMAL HIGH (ref 65–99)
Glucose-Capillary: 173 mg/dL — ABNORMAL HIGH (ref 65–99)

## 2017-02-16 LAB — BASIC METABOLIC PANEL
Anion gap: 9 (ref 5–15)
BUN: 34 mg/dL — ABNORMAL HIGH (ref 6–20)
CALCIUM: 7 mg/dL — AB (ref 8.9–10.3)
CHLORIDE: 106 mmol/L (ref 101–111)
CO2: 21 mmol/L — AB (ref 22–32)
CREATININE: 2.41 mg/dL — AB (ref 0.44–1.00)
GFR calc Af Amer: 26 mL/min — ABNORMAL LOW (ref >60)
GFR calc non Af Amer: 23 mL/min — ABNORMAL LOW (ref >60)
GLUCOSE: 184 mg/dL — AB (ref 65–99)
Potassium: 4.1 mmol/L (ref 3.5–5.1)
Sodium: 136 mmol/L (ref 135–145)

## 2017-02-16 LAB — LACTIC ACID, PLASMA
LACTIC ACID, VENOUS: 2.2 mmol/L — AB (ref 0.5–1.9)
Lactic Acid, Venous: 3.1 mmol/L (ref 0.5–1.9)
Lactic Acid, Venous: 1.6 mmol/L (ref 0.5–1.9)
Lactic Acid, Venous: 1 mmol/L (ref 0.5–1.9)
Lactic Acid, Venous: 1.2 mmol/L (ref 0.5–1.9)

## 2017-02-16 MED ORDER — DEXTROSE 5 % IV SOLN
2.0000 g | INTRAVENOUS | Status: DC
  Administered 2017-02-16: 2 g via INTRAVENOUS
  Filled 2017-02-16: qty 2

## 2017-02-16 MED ORDER — SODIUM CHLORIDE 0.9 % IV BOLUS (SEPSIS)
500.0000 mL | Freq: Once | INTRAVENOUS | Status: AC
  Administered 2017-02-16: 500 mL via INTRAVENOUS

## 2017-02-16 MED ORDER — OXYCODONE-ACETAMINOPHEN 5-325 MG PO TABS: 1.0000 | ORAL_TABLET | ORAL | 0 refills | Status: DC | PRN

## 2017-02-16 MED ORDER — SODIUM CHLORIDE 0.9 % IV BOLUS (SEPSIS): 500.0000 mL | Freq: Once | INTRAVENOUS | Status: AC

## 2017-02-16 NOTE — Plan of Care (Signed)
Pt states she felt better as soon as she came to floor and continues to improve.

## 2017-02-16 NOTE — Plan of Care (Signed)
Lactic Acid trending down.

## 2017-02-16 NOTE — Discharge Instructions (Addendum)
Call if fever > 101 or uncontrolled pain.   Ureteral Stent Implantation, Care After Refer to this sheet in the next few weeks. These instructions provide you with information about caring for yourself after your procedure. Your health care provider may also give you more specific instructions. Your treatment has been planned according to current medical practices, but problems sometimes occur. Call your health care provider if you have any problems or questions after your procedure. What can I expect after the procedure? After the procedure, it is common to have:  Nausea.  Mild pain when you urinate. You may feel this pain in your lower back or lower abdomen. Pain should stop within a few minutes after you urinate. This may last for up to 1 week.  A small amount of blood in your urine for several days.  Follow these instructions at home:  Medicines  Take over-the-counter and prescription medicines only as told by your health care provider.  If you were prescribed an antibiotic medicine, take it as told by your health care provider. Do not stop taking the antibiotic even if you start to feel better.  Do not drive for 24 hours if you received a sedative.  Do not drive or operate heavy machinery while taking prescription pain medicines. Activity  Return to your normal activities as told by your health care provider. Ask your health care provider what activities are safe for you.  Do not lift anything that is heavier than 10 lb (4.5 kg). Follow this limit for 1 week after your procedure, or for as long as told by your health care provider. General instructions  Watch for any blood in your urine. Call your health care provider if the amount of blood in your urine increases.  If you have a catheter: ? Follow instructions from your health care provider about taking care of your catheter and collection bag. ? Do not take baths, swim, or use a hot tub until your health care provider  approves.  Drink enough fluid to keep your urine clear or pale yellow.  Keep all follow-up visits as told by your health care provider. This is important. Contact a health care provider if:  You have pain that gets worse or does not get better with medicine, especially pain when you urinate.  You have difficulty urinating.  You feel nauseous or you vomit repeatedly during a period of more than 2 days after the procedure. Get help right away if:  Your urine is dark red or has blood clots in it.  You are leaking urine (have incontinence).  The end of the stent comes out of your urethra.  You cannot urinate.  You have sudden, sharp, or severe pain in your abdomen or lower back.  You have a fever. This information is not intended to replace advice given to you by your health care provider. Make sure you discuss any questions you have with your health care provider. Document Released: 10/13/2012 Document Revised: 07/19/2015 Document Reviewed: 08/25/2014 Elsevier Interactive Patient Education  [employer].

## 2017-02-16 NOTE — Progress Notes (Signed)
PHARMACY - PHYSICIAN COMMUNICATION CRITICAL VALUE ALERT - BLOOD CULTURE IDENTIFICATION (BCID)  Felicia Guerrero is an 46 y.o. female who presented to [facility] on 02/15/2017 with a chief complaint of UTI/sepsis  Assessment: BCID resulted:  4/4 cultures E coli with no resistance 12/23: CTX 2gm x1 @1500 , then 1gm q24 @ 1830 after stent (total 3gm yesterday) 12/24: CTX to 2gm q24, Dr. Alinda Money contacted  Name of physician (or Provider) Contacted: Dr. Alinda Money  Current antibiotics: Ceftriaxone 1gm q24  Changes to prescribed antibiotics recommended:  Increase Ceftriaxone to 2gm q24  Results for orders placed or performed during the hospital encounter of 02/15/17  Blood Culture ID Panel (Reflexed) (Collected: 02/15/2017  2:23 PM)  Result Value Ref Range   Enterococcus species NOT DETECTED NOT DETECTED   Listeria monocytogenes NOT DETECTED NOT DETECTED   Staphylococcus species NOT DETECTED NOT DETECTED   Staphylococcus aureus NOT DETECTED NOT DETECTED   Streptococcus species NOT DETECTED NOT DETECTED   Streptococcus agalactiae NOT DETECTED NOT DETECTED   Streptococcus pneumoniae NOT DETECTED NOT DETECTED   Streptococcus pyogenes NOT DETECTED NOT DETECTED   Acinetobacter baumannii NOT DETECTED NOT DETECTED   Enterobacteriaceae species DETECTED (A) NOT DETECTED   Enterobacter cloacae complex NOT DETECTED NOT DETECTED   Escherichia coli DETECTED (A) NOT DETECTED   Klebsiella oxytoca NOT DETECTED NOT DETECTED   Klebsiella pneumoniae NOT DETECTED NOT DETECTED   Proteus species NOT DETECTED NOT DETECTED   Serratia marcescens NOT DETECTED NOT DETECTED   Carbapenem resistance NOT DETECTED NOT DETECTED   Haemophilus influenzae NOT DETECTED NOT DETECTED   Neisseria meningitidis NOT DETECTED NOT DETECTED   Pseudomonas aeruginosa NOT DETECTED NOT DETECTED   Candida albicans NOT DETECTED NOT DETECTED   Candida glabrata NOT DETECTED NOT DETECTED   Candida krusei NOT DETECTED NOT DETECTED   Candida  parapsilosis NOT DETECTED NOT DETECTED   Candida tropicalis NOT DETECTED NOT DETECTED    Nyoka Cowden, Christien Frankl L 02/16/2017  8:28 AM

## 2017-02-16 NOTE — Progress Notes (Addendum)
Patient ID: Felicia Guerrero, female   DOB: 1970/03/30, 46 y.o.   MRN: [id number]  1 Day Post-Op Subjective: Pt feeling much better.  Denies pain this morning s/p stent placement yesterday.  Objective: Vital signs in last 24 hours: Temp:  [98.4 F (36.9 C)-100.3 F (37.9 C)] 98.4 F (36.9 C) (12/24 0610) Pulse Rate:  [85-137] 85 (12/24 0610) Resp:  [14-23] 16 (12/24 0610) BP: (93-128)/(58-70) 116/62 (12/24 0610) SpO2:  [97 %-100 %] 98 % (12/24 0610) Weight:  [67.6 kg (149 lb)] 67.6 kg (149 lb) (12/23 1447)  Intake/Output from previous day: 12/23 0701 - 12/24 0700 In: 8075.8 [I.V.:5142.5; IV Piggyback:2933.3] Out: 105 [Urine:100; Blood:5] Intake/Output this shift: No intake/output data recorded.  Physical Exam:  General: Alert and oriented Abd: No CVAT  Lab Results: Recent Labs    02/15/17 1256 02/16/17 0519  HGB 11.4* 10.8*  HCT 33.4* 32.5*   CBC Latest Ref Rng & Units 02/16/2017 02/15/2017  WBC 4.0 - 10.5 K/uL 22.7(H) 19.7(H)  Hemoglobin 12.0 - 15.0 g/dL 10.8(L) 11.4(L)  Hematocrit 36.0 - 46.0 % 32.5(L) 33.4(L)  Platelets 150 - 400 K/uL 182 238     BMET Recent Labs    02/15/17 1256 02/16/17 0519  NA 132* 136  K 3.7 4.1  CL 95* 106  CO2 22 21*  GLUCOSE 295* 184*  BUN 30* 34*  CREATININE 2.64* 2.41*  CALCIUM 8.3* 7.0*     Studies/Results: Urine culture: GNRs Blood cultures: GNRs   Assessment/Plan: 1) Left ureteral stone with sepsis: Pt feeling better.  WBC slightly increased and lactic acid now trending back down but has not been checked since last night for unclear reasons.  Will continue aggressive IVF hydration and recheck lactic acid this morning.  Continue ceftriaxone pending culture results.  Source control s/p left ureteral stent.  2) DM: Continue SSI.  3) H/O stroke: Continue ASA 81 mg.  4) AKI with CKD: Renal function improving.  Baseline Cr unknown but reportedly between 1.5 -2.0 per ED yesterday.   LOS: 1 day   Felicia Guerrero,LES 02/16/2017,  7:52 AM

## 2017-02-16 NOTE — Progress Notes (Signed)
Taking over care of patient agree with previous RN assessment. Denies any needs at this time, will continue to monitor.

## 2017-02-16 NOTE — Progress Notes (Signed)
CRITICAL VALUE ALERT  Critical Value: Lactic Acid 3.1  Date & Time Notied:  02/16/17  Provider Notified: Dr Alinda Money   Orders Received/Actions taken: yes

## 2017-02-17 DIAGNOSIS — N12 Tubulo-interstitial nephritis, not specified as acute or chronic: Secondary | ICD-10-CM | POA: Diagnosis present

## 2017-02-17 DIAGNOSIS — N289 Disorder of kidney and ureter, unspecified: Secondary | ICD-10-CM

## 2017-02-17 DIAGNOSIS — A419 Sepsis, unspecified organism: Secondary | ICD-10-CM | POA: Diagnosis present

## 2017-02-17 LAB — CBC
HEMATOCRIT: 33 % — AB (ref 36.0–46.0)
HEMOGLOBIN: 10.9 g/dL — AB (ref 12.0–15.0)
MCH: 29.1 pg (ref 26.0–34.0)
MCHC: 33 g/dL (ref 30.0–36.0)
MCV: 88.2 fL (ref 78.0–100.0)
Platelets: 205 10*3/uL (ref 150–400)
RBC: 3.74 MIL/uL — ABNORMAL LOW (ref 3.87–5.11)
RDW: 14.1 % (ref 11.5–15.5)
WBC: 15.6 10*3/uL — ABNORMAL HIGH (ref 4.0–10.5)

## 2017-02-17 LAB — URINE CULTURE: Culture: 50000 — AB

## 2017-02-17 LAB — BASIC METABOLIC PANEL
ANION GAP: 6 (ref 5–15)
BUN: 28 mg/dL — ABNORMAL HIGH (ref 6–20)
CHLORIDE: 111 mmol/L (ref 101–111)
CO2: 23 mmol/L (ref 22–32)
CREATININE: 1.89 mg/dL — AB (ref 0.44–1.00)
Calcium: 7.3 mg/dL — ABNORMAL LOW (ref 8.9–10.3)
GFR calc non Af Amer: 31 mL/min — ABNORMAL LOW (ref >60)
GFR, EST AFRICAN AMERICAN: 35 mL/min — AB (ref >60)
Glucose, Bld: 126 mg/dL — ABNORMAL HIGH (ref 65–99)
Potassium: 3.7 mmol/L (ref 3.5–5.1)
Sodium: 140 mmol/L (ref 135–145)

## 2017-02-17 LAB — GLUCOSE, CAPILLARY
GLUCOSE-CAPILLARY: 130 mg/dL — AB (ref 65–99)
GLUCOSE-CAPILLARY: 122 mg/dL — AB (ref 65–99)
Glucose-Capillary: 95 mg/dL (ref 65–99)

## 2017-02-17 MED ORDER — CEFUROXIME AXETIL 500 MG PO TABS
500.0000 mg | ORAL_TABLET | Freq: Once | ORAL | Status: AC
  Administered 2017-02-17: 500 mg via ORAL
  Filled 2017-02-17: qty 1

## 2017-02-17 MED ORDER — CEPHALEXIN 500 MG PO CAPS: 500.0000 mg | ORAL_CAPSULE | Freq: Three times a day (TID) | ORAL | 0 refills | Status: AC

## 2017-02-17 NOTE — Discharge Summary (Signed)
Physician Discharge Summary  Patient ID: Felicia Guerrero MRN: [id number] DOB/AGE: September 17, 1970 46 y.o.  Admit date: 02/15/2017 Discharge date: 02/17/2017  Admission Diagnoses:  Left ureteral stone  Discharge Diagnoses:  Principal Problem:   Left ureteral stone Active Problems:   Pyelonephritis   Sepsis ([place])   Acute renal insufficiency   Past Medical History:  Diagnosis Date  . Diabetes mellitus without complication (Felicia Guerrero)   . Hypertension   . Kidney stones   . Stroke [facility])     Surgeries: Procedure(s): CYSTOSCOPY WITH LEFT URETERAL STENT PLACEMENT on 02/15/2017   Consultants (if any): Treatment Team:  Raynelle Bring, MD  Discharged Condition: Improved  Hospital Course: Felicia Guerrero is an 46 y.o. female who was admitted 02/15/2017 with a diagnosis of Left ureteral stone and went to the operating room on 02/15/2017 and underwent the above named procedures.  She was admitted for management of sepsis with rocephin and her culture has returned growing e. Coli.  She had AKI on admission and her Cr has declined with treatment to 1.89 and her WBC is trending down.  She is afebrile.   She was given perioperative antibiotics:  Anti-infectives (From admission, onward)   Start     Dose/Rate Route Frequency Ordered Stop   02/17/17 0000  cephALEXin (KEFLEX) 500 MG capsule     500 mg Oral 3 times daily 02/17/17 0934 03/03/17 2359   02/16/17 1800  cefTRIAXone (ROCEPHIN) 2 g in dextrose 5 % 50 mL IVPB     2 g 100 mL/hr over 30 Minutes Intravenous Every 24 hours 02/16/17 0850     02/15/17 1837  dextrose 5 % with cefTRIAXone (ROCEPHIN) ADS Med    Comments:  Guerry Bruin   : cabinet override      02/15/17 1837 02/16/17 0644   02/15/17 1834  dextrose 5 % with cefTRIAXone (ROCEPHIN) ADS Med  Status:  Discontinued    Comments:  Guerry Bruin   : cabinet override      02/15/17 1834 02/15/17 1838   02/15/17 1830  cefTRIAXone (ROCEPHIN) 1 g in dextrose 5 % 50 mL IVPB  Status:  Discontinued      1 g 100 mL/hr over 30 Minutes Intravenous Every 24 hours 02/15/17 1827 02/16/17 0850   02/15/17 1430  cefTRIAXone (ROCEPHIN) 2 g in dextrose 5 % 50 mL IVPB     2 g 100 mL/hr over 30 Minutes Intravenous  Once 02/15/17 1418 02/15/17 1551    .  She was given sequential compression devices and lovenox for DVT prophylaxis.  She benefited maximally from the hospital stay and there were no complications.    Recent vital signs:  Vitals:   02/16/17 2055 02/17/17 0453  BP: 133/68 129/66  Pulse: 82 86  Resp: 18 16  Temp: 99.2 F (37.3 C) 98.5 F (36.9 C)  SpO2: 100% 100%    Recent laboratory studies:  Lab Results  Component Value Date   HGB 10.9 (L) 02/17/2017   HGB 10.8 (L) 02/16/2017   HGB 11.4 (L) 02/15/2017   Lab Results  Component Value Date   WBC 15.6 (H) 02/17/2017   PLT 205 02/17/2017   No results found for: INR Lab Results  Component Value Date   NA 140 02/17/2017   K 3.7 02/17/2017   CL 111 02/17/2017   CO2 23 02/17/2017   BUN 28 (H) 02/17/2017   CREATININE 1.89 (H) 02/17/2017   GLUCOSE 126 (H) 02/17/2017    Discharge Medications:   Allergies as of 02/17/2017  No Known Allergies     Medication List    TAKE these medications   acetaminophen 500 MG tablet Commonly known as:  TYLENOL Take 1,000 mg by mouth every 6 (six) hours as needed for moderate pain.   aspirin 81 MG chewable tablet Chew 81 mg by mouth at bedtime.   cephALEXin 500 MG capsule Commonly known as:  KEFLEX Take 1 capsule (500 mg total) by mouth 3 (three) times daily for 14 days.   hydrOXYzine 25 MG tablet Commonly known as:  ATARAX/VISTARIL Take 25 mg by mouth daily as needed for anxiety.   JANUMET XR 50-1000 MG Tb24 Generic drug:  SitaGLIPtin-MetFORMIN HCl Take 1 tablet by mouth daily.   lisinopril 40 MG tablet Commonly known as:  PRINIVIL,ZESTRIL Take 20 mg by mouth 2 (two) times daily.   metFORMIN 500 MG 24 hr tablet Commonly known as:  GLUCOPHAGE-XR Take 1,000 mg by  mouth daily.   multivitamin with minerals Tabs tablet Take 1 tablet by mouth daily.   NIFEdipine 90 MG 24 hr tablet Commonly known as:  PROCARDIA XL/ADALAT-CC Take 90 mg by mouth daily.   oxyCODONE-acetaminophen 5-325 MG tablet Commonly known as:  ROXICET Take 1 tablet by mouth every 4 (four) hours as needed for severe pain.   prednisoLONE acetate 1 % ophthalmic suspension Commonly known as:  PRED FORTE Place 1 drop into the right eye daily.   rosuvastatin 20 MG tablet Commonly known as:  CRESTOR Take 20 mg by mouth every evening.       Diagnostic Studies: Dg C-arm 1-60 Min-no Report  Result Date: 02/15/2017 Fluoroscopy was utilized by the requesting physician.  No radiographic interpretation.   Ct Renal Stone Study  Result Date: 02/15/2017 CLINICAL DATA:  Left kidney pain.  Vomiting and feeling hot. EXAM: CT ABDOMEN AND PELVIS WITHOUT CONTRAST TECHNIQUE: Multidetector CT imaging of the abdomen and pelvis was performed following the standard protocol without IV contrast. COMPARISON:  None. FINDINGS: Lower chest: Lingular airspace disease likely reflecting atelectasis. Trace left pleural effusion. Hepatobiliary: No focal liver abnormality is seen. No gallstones, gallbladder wall thickening, or biliary dilatation. Pancreas: Unremarkable. No pancreatic ductal dilatation or surrounding inflammatory changes. Spleen: Normal in size without focal abnormality. Adrenals/Urinary Tract: Adrenal glands are normal punctate nonobstructing right renal calculus. Right kidney is otherwise normal. Multiple nonobstructing left renal calculi with the largest measuring 7 mm in the midpole. 8 mm proximal left ureteral calculus resulting and moderate left hydronephrosis and mild perinephric stranding. Bladder is normal. Stomach/Bowel: No bowel wall thickening or bowel dilatation. No pneumatosis, pneumoperitoneum or portal venous gas. Normal appendix. No bowel obstruction. Vascular/Lymphatic: Abdominal  aortic atherosclerosis. Normal caliber abdominal aorta. Reproductive: Status post hysterectomy. No adnexal masses. Other: Small amount pelvic free fluid. Musculoskeletal: No acute osseous abnormality. No aggressive osseous lesion. IMPRESSION: 1. 8 mm proximal left ureteral calculus resulting and moderate left hydronephrosis and mild perinephric stranding. 2. Bilateral nonobstructing renal calculi. 3. Trace left pleural effusion. 4.  Aortic Atherosclerosis (ICD10-170.0) Electronically Signed   By: Kathreen Devoid   On: 02/15/2017 14:06    Disposition: 01-Home or Self Care  Discharge Instructions    Discontinue IV   Complete by:  As directed       Follow-up Information    Raynelle Bring, MD Follow up.   Specialty:  Urology Why:  Office will call to schedule for about 2 weeks. Contact information: [place] [address] [phone]            Signed:  Irine Seal 02/17/2017, 9:36 AM

## 2017-02-17 NOTE — Plan of Care (Signed)
Discharge instructions reviewed with patient and daughter, questions answered, verbalized understanding.  Patients pharmacy is closed and she is unable to find a pharmacy open to get her antibiotics filled d/t the holiday.  Spoke with Dr. Jeffie Pollock regarding this who ordered Ceftin 500 mg to be given to patient prior to DC.  Ceftin given.  Patient transported to main entrance of hospital to be taken home by fiance.

## 2017-02-18 ENCOUNTER — Encounter (HOSPITAL_COMMUNITY): Payer: Self-pay | Admitting: Urology

## 2017-02-18 LAB — GLUCOSE, CAPILLARY: GLUCOSE-CAPILLARY: 225 mg/dL — AB (ref 65–99)

## 2017-02-18 LAB — CULTURE, BLOOD (ROUTINE X 2)
SPECIAL REQUESTS: ADEQUATE
Special Requests: ADEQUATE

## 2017-02-23 NOTE — Anesthesia Postprocedure Evaluation (Signed)
Anesthesia Post Note  Patient: Felicia Guerrero  Procedure(s) Performed: CYSTOSCOPY WITH LEFT URETERAL STENT PLACEMENT (Left Ureter)     Patient location during evaluation: PACU Anesthesia Type: General Level of consciousness: awake, awake and alert and oriented Pain management: pain level controlled Vital Signs Assessment: post-procedure vital signs reviewed and stable Respiratory status: spontaneous breathing, nonlabored ventilation and respiratory function stable Cardiovascular status: blood pressure returned to baseline Anesthetic complications: no    Last Vitals:  Vitals:   02/17/17 0453 02/17/17 0944  BP: 129/66 140/68  Pulse: 86 86  Resp: 16   Temp: 36.9 C   SpO2: 100% 100%    Last Pain:  Vitals:   02/17/17 0730  TempSrc:   PainSc: 0-No pain                 Kaelob Persky COKER

## 2017-03-05 ENCOUNTER — Other Ambulatory Visit: Payer: Self-pay | Admitting: Urology

## 2017-03-10 ENCOUNTER — Encounter (HOSPITAL_COMMUNITY): Payer: Self-pay

## 2017-03-10 NOTE — Progress Notes (Signed)
EKG 02-16-17 Epic  HGBA1C 01-06-17 CHART  LABS 02-17-17 Epic CMP,CBC  OVN 01-06-17 DM  FNP  WANDA RUSHTON

## 2017-03-10 NOTE — Patient Instructions (Addendum)
Felicia Guerrero  03/10/2017   Your procedure is scheduled on: 03-12-17  Report to [facility] Main  Entrance              Report to admitting at    1230 PM   Call this number if you have problems the morning of surgery [phone]   Remember: Do not eat food:After Midnight. YOU MAY HAVE CLEAR LIQUIDS UNTIL 0830 AM THEN NOTHING BY MOUTH     Take these medicines the morning of surgery with A SIP OF WATER:  EYE DROPS, NIFEDIPINE(PROCARDIA) DO NOT TAKE ANY DIABETIC MEDICATIONS DAY OF YOUR SURGERY                               You may not have any metal on your body including hair pins and              piercings  Do not wear jewelry, make-up, lotions, powders or perfumes, deodorant             Do not wear nail polish.  Do not shave  48 hours prior to surgery.              Do not bring valuables to the hospital. [place].  Contacts, dentures or bridgework may not be worn into surgery.      Patients discharged the day of surgery will not be allowed to drive home.  Name and phone number of your driver:  Special Instructions: N/A              Please read over the following fact sheets you were given: _____________________________________________________________________             [facility] - Preparing for Surgery Before surgery, you can play an important role.  Because skin is not sterile, your skin needs to be as free of germs as possible.  You can reduce the number of germs on your skin by washing with CHG (chlorahexidine gluconate) soap before surgery.  CHG is an antiseptic cleaner which kills germs and bonds with the skin to continue killing germs even after washing. Please DO NOT use if you have an allergy to CHG or antibacterial soaps.  If your skin becomes reddened/irritated stop using the CHG and inform your nurse when you arrive at Short Stay. Do not shave (including legs and underarms) for at least  48 hours prior to the first CHG shower.  You may shave your face/neck. Please follow these instructions carefully:  1.  Shower with CHG Soap the night before surgery and the  morning of Surgery.  2.  If you choose to wash your hair, wash your hair first as usual with your  normal  shampoo.  3.  After you shampoo, rinse your hair and body thoroughly to remove the  shampoo.                           4.  Use CHG as you would any other liquid soap.  You can apply chg directly  to the skin and wash                       Gently with a  scrungie or clean washcloth.  5.  Apply the CHG Soap to your body ONLY FROM THE NECK DOWN.   Do not use on face/ open                           Wound or open sores. Avoid contact with eyes, ears mouth and genitals (private parts).                       Wash face,  Genitals (private parts) with your normal soap.             6.  Wash thoroughly, paying special attention to the area where your surgery  will be performed.  7.  Thoroughly rinse your body with warm water from the neck down.  8.  DO NOT shower/wash with your normal soap after using and rinsing off  the CHG Soap.                9.  Pat yourself dry with a clean towel.            10.  Wear clean pajamas.            11.  Place clean sheets on your bed the night of your first shower and do not  sleep with pets. Day of Surgery : Do not apply any lotions/deodorants the morning of surgery.  Please wear clean clothes to the hospital/surgery center.  FAILURE TO FOLLOW THESE INSTRUCTIONS MAY RESULT IN THE CANCELLATION OF YOUR SURGERY PATIENT SIGNATURE_________________________________  NURSE SIGNATURE__________________________________  ________________________________________________________________________    CLEAR LIQUID DIET   Foods Allowed                                                                     Foods Excluded  Coffee and tea, regular and decaf                             liquids that you cannot   Plain Jell-O in any flavor                                             see through such as: Fruit ices (not with fruit pulp)                                     milk, soups, orange juice  Iced Popsicles                                    All solid food Carbonated beverages, regular and diet                                    Cranberry, grape and apple juices Sports drinks like Gatorade Lightly seasoned clear broth or consume(fat free) Sugar, honey syrup  Sample Menu Breakfast  Lunch                                     Supper Cranberry juice                    Beef broth                            Chicken broth Jell-O                                     Grape juice                           Apple juice Coffee or tea                        Jell-O                                      Popsicle                                                Coffee or tea                        Coffee or tea  _____________________________________________________________________

## 2017-03-11 ENCOUNTER — Encounter (HOSPITAL_COMMUNITY): Payer: Self-pay

## 2017-03-11 ENCOUNTER — Encounter (HOSPITAL_COMMUNITY)
Admission: RE | Admit: 2017-03-11 | Discharge: 2017-03-11 | Disposition: A | Discharge status: Home / Self Care | Payer: Medicaid Other | Source: Ambulatory Visit | Attending: Urology | Admitting: Urology

## 2017-03-11 ENCOUNTER — Other Ambulatory Visit: Payer: Self-pay

## 2017-03-11 DIAGNOSIS — N202 Calculus of kidney with calculus of ureter: Secondary | ICD-10-CM | POA: Insufficient documentation

## 2017-03-11 DIAGNOSIS — Z01818 Encounter for other preprocedural examination: Secondary | ICD-10-CM | POA: Diagnosis present

## 2017-03-11 HISTORY — DX: Unspecified osteoarthritis, unspecified site: M19.90

## 2017-03-11 HISTORY — DX: Depression, unspecified: F32.A

## 2017-03-11 HISTORY — DX: Personal history of urinary calculi: Z87.442

## 2017-03-11 HISTORY — DX: Anemia, unspecified: D64.9

## 2017-03-11 HISTORY — DX: Chronic kidney disease, unspecified: N18.9

## 2017-03-11 HISTORY — DX: Major depressive disorder, single episode, unspecified: F32.9

## 2017-03-11 LAB — CBC
HCT: 30.6 % — ABNORMAL LOW (ref 36.0–46.0)
Hemoglobin: 9.8 g/dL — ABNORMAL LOW (ref 12.0–15.0)
MCH: 28.5 pg (ref 26.0–34.0)
MCHC: 32 g/dL (ref 30.0–36.0)
MCV: 89 fL (ref 78.0–100.0)
PLATELETS: 327 10*3/uL (ref 150–400)
RBC: 3.44 MIL/uL — ABNORMAL LOW (ref 3.87–5.11)
RDW: 14 % (ref 11.5–15.5)
WBC: 4.9 10*3/uL (ref 4.0–10.5)

## 2017-03-11 LAB — BASIC METABOLIC PANEL
Anion gap: 6 (ref 5–15)
BUN: 20 mg/dL (ref 6–20)
CALCIUM: 9.1 mg/dL (ref 8.9–10.3)
CO2: 27 mmol/L (ref 22–32)
CREATININE: 1.35 mg/dL — AB (ref 0.44–1.00)
Chloride: 104 mmol/L (ref 101–111)
GFR calc non Af Amer: 46 mL/min — ABNORMAL LOW (ref >60)
GFR, EST AFRICAN AMERICAN: 53 mL/min — AB (ref >60)
Glucose, Bld: 145 mg/dL — ABNORMAL HIGH (ref 65–99)
Potassium: 4.9 mmol/L (ref 3.5–5.1)
SODIUM: 137 mmol/L (ref 135–145)

## 2017-03-11 LAB — GLUCOSE, CAPILLARY: GLUCOSE-CAPILLARY: 134 mg/dL — AB (ref 65–99)

## 2017-03-11 NOTE — Progress Notes (Signed)
Cbc done 03-11-16 routed to Dr. Alinda Money via epic

## 2017-03-11 NOTE — H&P (Signed)
Office Visit Report     03/04/2017   --------------------------------------------------------------------------------   Felicia Guerrero  MRN: [id number]  PRIMARY CARE:    DOB: Mar 06, 1970, 47 year old Female  REFERRING:    SSN:   PROVIDER:  Raynelle Bring, M.D.    LOCATION:  [facility] [phone]   --------------------------------------------------------------------------------   CC/HPI: Left ureteral calculus   She returns today after having originally presented to the emergency department just before Christmas with a left ureteral stone and developing sepsis. She underwent left ureteral stent placement. Her urine culture subsequently was positive for E. coli. She originally was discharged home on cephalexin. She developed GI symptoms related to this medication and eventually was switched to Bactrim. She is scheduled to finish her Bactrim today. Currently, she denies any fever, flank pain, hematuria, or bothersome urinary symptoms despite her left ureteral stent. She has a long-standing history of urolithiasis with numerous procedures performed in [place] previously. She has apparently completed a 24-hour urine one year ago by her urologist in [place].     ALLERGIES: Cephalexin - Diarrhea    MEDICATIONS: Aspirin 81 mg tablet, chewable  Lisinopril  Metformin Hcl  Janumet  Nifedipine  Prednisolone Acetate 1 % suspension, drops  Rosuvastatin Calcium     GU PSH: Cystoscopy Insert Stent, Left - 02/15/2017 Hysterectomy    NON-GU PSH: Eye Surgery Procedure, Right    GU PMH: None   NON-GU PMH: Diabetes Type 2 Hypercholesterolemia Hypertension Stroke/TIA    FAMILY HISTORY: None    Notes: 2 daughters   SOCIAL HISTORY: Marital Status: Single Preferred Language: English; Ethnicity: Not Hispanic Or Latino; Race: Black or African American Current Smoking Status: Patient has never smoked.   Tobacco Use Assessment Completed: Used Tobacco in last 30  days? Does not drink anymore.  Drinks 1 caffeinated drink per day.    REVIEW OF SYSTEMS:    GU Review Female:   Patient reports get up at night to urinate. Patient denies frequent urination, hard to postpone urination, burning /pain with urination, leakage of urine, stream starts and stops, trouble starting your stream, have to strain to urinate, and currently pregnant.  Gastrointestinal (Lower):   Patient denies diarrhea and constipation.  Gastrointestinal (Upper):   Patient denies nausea and vomiting.  Constitutional:   Patient denies fever, night sweats, weight loss, and fatigue.  Skin:   Patient denies skin rash/ lesion and itching.  Eyes:   Patient denies blurred vision and double vision.  Ears/ Nose/ Throat:   Patient denies sore throat and sinus problems.  Hematologic/Lymphatic:   Patient denies easy bruising and swollen glands.  Cardiovascular:   Patient denies leg swelling and chest pains.  Respiratory:   Patient denies cough and shortness of breath.  Endocrine:   Patient denies excessive thirst.  Musculoskeletal:   Patient denies back pain and joint pain.  Neurological:   Patient denies headaches and dizziness.  Psychologic:   Patient denies depression and anxiety.   VITAL SIGNS:      03/04/2017 01:58 PM  Weight 149 lb / 67.59 kg  Height 56 in / 142.24 cm  BP 185/91 mmHg  Pulse 83 /min  BMI 33.4 kg/m   MULTI-SYSTEM PHYSICAL EXAMINATION:    Constitutional: Well-nourished. No physical deformities. Normally developed. Good grooming.  Respiratory: No labored breathing, no use of accessory muscles. Clear bilaterally.  Cardiovascular: Normal temperature, normal extremity pulses, no swelling, no varicosities. Regular rate and rhythm.  Gastrointestinal: No CVA tenderness.     PAST DATA  REVIEWED:  Source Of History:  Patient  Records Review:   Previous Patient Records  Urine Test Review:   Urinalysis, Urine Culture  X-Ray Review: C.T. Abdomen/Pelvis: Reviewed Films.      PROCEDURES:          Urinalysis w/Scope Dipstick Dipstick Cont'd Micro  Color: Yellow Bilirubin: Neg WBC/hpf: 10 - 20/hpf  Appearance: Cloudy Ketones: Trace RBC/hpf: >60/hpf  Specific Gravity: 1.020 Blood: 3+ Bacteria: Few (10-25/hpf)  pH: 6.0 Protein: 3+ Cystals: NS (Not Seen)  Glucose: Neg Urobilinogen: 0.2 Casts: NS (Not Seen)    Nitrites: Neg Trichomonas: Not Present    Leukocyte Esterase: 1+ Mucous: Not Present      Epithelial Cells: 0 - 5/hpf      Yeast: NS (Not Seen)      Sperm: Not Present    Notes: confirmed by icto test    ASSESSMENT:      ICD-10 Details  1 GU:   Ureteral calculus - N20.1    PLAN:           Orders Labs Urine Culture          Schedule Return Visit/Planned Activity: Other See Visit Notes             Note: Will call to schedule surgery.          Document Letter(s):  Created for Patient: Clinical Summary         Notes:   1. Left ureteral and renal calculus: We have discussed management options for both her proximal 8 mm left ureteral stone and her nonobstructing left renal calculus. We discussed the option of shock wave lithotripsy versus ureteroscopic laser lithotripsy. After discussion, considering her stone density on CT, her desire to be rendered stone free and also undergo treatment of her renal calculus, she has elected to proceed with ureteroscopic management. She will therefore be scheduled for left ureteroscopy with laser lithotripsy of both her left ureteral and left renal calculus. She understands the need for a postoperative ureteral stent. We did discuss the potential risks, complications, and expected recovery process associated with this procedure. She gives informed consent to proceed. She will remain on her baby aspirin 81 mg perioperatively. Her urine has been recultured today. She will be treated appropriately perioperatively pending her culture results.   2. Right renal calculi and recurrent urolithiasis: I will obtain her records  from [place] with regard to her 24-hour urine and prior stone analyses. We will discuss stone prevention in follow-up for her right renal calculi at her postoperative visit.   Cc: [facility]    * Signed by Raynelle Bring, M.D. on 03/04/17 at 5:20 PM (EST)*

## 2017-03-12 ENCOUNTER — Ambulatory Visit (HOSPITAL_COMMUNITY): Payer: Medicaid Other

## 2017-03-12 ENCOUNTER — Encounter (HOSPITAL_COMMUNITY)
Admission: RE | Disposition: A | Discharge status: Home / Self Care | Payer: Self-pay | Source: Ambulatory Visit | Attending: Urology

## 2017-03-12 ENCOUNTER — Ambulatory Visit (HOSPITAL_COMMUNITY): Payer: Medicaid Other | Admitting: Anesthesiology

## 2017-03-12 ENCOUNTER — Encounter (HOSPITAL_COMMUNITY): Payer: Self-pay | Admitting: Certified Registered Nurse Anesthetist

## 2017-03-12 ENCOUNTER — Ambulatory Visit (HOSPITAL_COMMUNITY)
Admission: RE | Admit: 2017-03-12 | Discharge: 2017-03-12 | Disposition: A | Discharge status: Home / Self Care | Payer: Medicaid Other | Source: Ambulatory Visit | Attending: Urology | Admitting: Urology

## 2017-03-12 DIAGNOSIS — Z888 Allergy status to other drugs, medicaments and biological substances status: Secondary | ICD-10-CM | POA: Diagnosis not present

## 2017-03-12 DIAGNOSIS — N202 Calculus of kidney with calculus of ureter: Secondary | ICD-10-CM | POA: Insufficient documentation

## 2017-03-12 DIAGNOSIS — Z7982 Long term (current) use of aspirin: Secondary | ICD-10-CM | POA: Insufficient documentation

## 2017-03-12 DIAGNOSIS — E119 Type 2 diabetes mellitus without complications: Secondary | ICD-10-CM | POA: Diagnosis not present

## 2017-03-12 DIAGNOSIS — I6930 Unspecified sequelae of cerebral infarction: Secondary | ICD-10-CM | POA: Insufficient documentation

## 2017-03-12 DIAGNOSIS — Z7984 Long term (current) use of oral hypoglycemic drugs: Secondary | ICD-10-CM | POA: Insufficient documentation

## 2017-03-12 DIAGNOSIS — Z79899 Other long term (current) drug therapy: Secondary | ICD-10-CM | POA: Diagnosis not present

## 2017-03-12 DIAGNOSIS — I10 Essential (primary) hypertension: Secondary | ICD-10-CM | POA: Insufficient documentation

## 2017-03-12 HISTORY — PX: CYSTOSCOPY/URETEROSCOPY/HOLMIUM LASER/STENT PLACEMENT: SHX6546

## 2017-03-12 LAB — GLUCOSE, CAPILLARY
GLUCOSE-CAPILLARY: 129 mg/dL — AB (ref 65–99)
Glucose-Capillary: 121 mg/dL — ABNORMAL HIGH (ref 65–99)

## 2017-03-12 SURGERY — CYSTOSCOPY/URETEROSCOPY/HOLMIUM LASER/STENT PLACEMENT
Anesthesia: General | Site: Ureter | Laterality: Left

## 2017-03-12 MED ORDER — MIDAZOLAM HCL 2 MG/2ML IJ SOLN
INTRAMUSCULAR | Status: AC
  Filled 2017-03-12: qty 2

## 2017-03-12 MED ORDER — FENTANYL CITRATE (PF) 100 MCG/2ML IJ SOLN
INTRAMUSCULAR | Status: AC
  Filled 2017-03-12: qty 2

## 2017-03-12 MED ORDER — MIDAZOLAM HCL 5 MG/5ML IJ SOLN
INTRAMUSCULAR | Status: DC | PRN
  Administered 2017-03-12: 2 mg via INTRAVENOUS

## 2017-03-12 MED ORDER — OXYCODONE-ACETAMINOPHEN 5-325 MG PO TABS: 1.0000 | ORAL_TABLET | ORAL | 0 refills | Status: DC | PRN

## 2017-03-12 MED ORDER — SULFAMETHOXAZOLE-TRIMETHOPRIM 800-160 MG PO TABS: 1.0000 | ORAL_TABLET | Freq: Two times a day (BID) | ORAL | 0 refills | Status: DC

## 2017-03-12 MED ORDER — PROPOFOL 10 MG/ML IV BOLUS
INTRAVENOUS | Status: AC
  Filled 2017-03-12: qty 20

## 2017-03-12 MED ORDER — LACTATED RINGERS IV SOLN
INTRAVENOUS | Status: DC
  Administered 2017-03-12 (×2): via INTRAVENOUS

## 2017-03-12 MED ORDER — DEXAMETHASONE SODIUM PHOSPHATE 10 MG/ML IJ SOLN
INTRAMUSCULAR | Status: DC | PRN
  Administered 2017-03-12: 5 mg via INTRAVENOUS

## 2017-03-12 MED ORDER — IOHEXOL 300 MG/ML  SOLN
INTRAMUSCULAR | Status: DC | PRN
  Administered 2017-03-12: 5 mL via INTRAVENOUS

## 2017-03-12 MED ORDER — LIDOCAINE 2% (20 MG/ML) 5 ML SYRINGE
INTRAMUSCULAR | Status: DC | PRN
  Administered 2017-03-12: 60 mg via INTRAVENOUS

## 2017-03-12 MED ORDER — PROPOFOL 10 MG/ML IV BOLUS
INTRAVENOUS | Status: DC | PRN
  Administered 2017-03-12: 150 mg via INTRAVENOUS

## 2017-03-12 MED ORDER — DEXTROSE 5 % IV SOLN
2.0000 g | Freq: Once | INTRAVENOUS | Status: AC
  Administered 2017-03-12: 2 g via INTRAVENOUS
  Filled 2017-03-12: qty 2

## 2017-03-12 MED ORDER — MEPERIDINE HCL 50 MG/ML IJ SOLN
INTRAMUSCULAR | Status: AC
  Filled 2017-03-12: qty 1

## 2017-03-12 MED ORDER — METOCLOPRAMIDE HCL 5 MG/ML IJ SOLN: 10.0000 mg | Freq: Once | INTRAMUSCULAR | Status: DC | PRN

## 2017-03-12 MED ORDER — FENTANYL CITRATE (PF) 100 MCG/2ML IJ SOLN
25.0000 ug | INTRAMUSCULAR | Status: DC | PRN
  Administered 2017-03-12 (×3): 50 ug via INTRAVENOUS

## 2017-03-12 MED ORDER — ONDANSETRON HCL 4 MG/2ML IJ SOLN
INTRAMUSCULAR | Status: DC | PRN
  Administered 2017-03-12: 4 mg via INTRAVENOUS

## 2017-03-12 MED ORDER — LABETALOL HCL 5 MG/ML IV SOLN
INTRAVENOUS | Status: AC
  Filled 2017-03-12: qty 4

## 2017-03-12 MED ORDER — FENTANYL CITRATE (PF) 100 MCG/2ML IJ SOLN
INTRAMUSCULAR | Status: DC | PRN
  Administered 2017-03-12 (×2): 50 ug via INTRAVENOUS

## 2017-03-12 MED ORDER — LIDOCAINE 2% (20 MG/ML) 5 ML SYRINGE
INTRAMUSCULAR | Status: AC
  Filled 2017-03-12: qty 25

## 2017-03-12 MED ORDER — MEPERIDINE HCL 50 MG/ML IJ SOLN
6.2500 mg | INTRAMUSCULAR | Status: DC | PRN
  Administered 2017-03-12: 6.25 mg via INTRAVENOUS

## 2017-03-12 MED ORDER — LABETALOL HCL 5 MG/ML IV SOLN
5.0000 mg | INTRAVENOUS | Status: DC | PRN
  Administered 2017-03-12 (×3): 5 mg via INTRAVENOUS

## 2017-03-12 MED ORDER — SODIUM CHLORIDE 0.9 % IR SOLN
Status: DC | PRN
  Administered 2017-03-12: 3000 mL via INTRAVESICAL

## 2017-03-12 MED ORDER — LACTATED RINGERS IV SOLN: INTRAVENOUS | Status: DC

## 2017-03-12 SURGICAL SUPPLY — 19 items
BAG URO CATCHER STRL LF (MISCELLANEOUS) ×2 IMPLANT
BASKET ZERO TIP NITINOL 2.4FR (BASKET) ×2 IMPLANT
CATH INTERMIT  6FR 70CM (CATHETERS) ×2 IMPLANT
CLOTH BEACON ORANGE TIMEOUT ST (SAFETY) ×2 IMPLANT
COVER FOOTSWITCH UNIV (MISCELLANEOUS) ×2 IMPLANT
COVER SURGICAL LIGHT HANDLE (MISCELLANEOUS) ×2 IMPLANT
FIBER LASER FLEXIVA 365 (UROLOGICAL SUPPLIES) IMPLANT
FIBER LASER TRAC TIP (UROLOGICAL SUPPLIES) ×2 IMPLANT
GLOVE BIOGEL M STRL SZ7.5 (GLOVE) ×2 IMPLANT
GOWN STRL REUS W/TWL LRG LVL3 (GOWN DISPOSABLE) ×4 IMPLANT
GUIDEWIRE ANG ZIPWIRE 038X150 (WIRE) IMPLANT
GUIDEWIRE STR DUAL SENSOR (WIRE) ×2 IMPLANT
IV NS 1000ML (IV SOLUTION) ×1
IV NS 1000ML BAXH (IV SOLUTION) ×1 IMPLANT
MANIFOLD NEPTUNE II (INSTRUMENTS) ×2 IMPLANT
PACK CYSTO (CUSTOM PROCEDURE TRAY) ×2 IMPLANT
SHEATH URETERAL 12FRX35CM (MISCELLANEOUS) ×2 IMPLANT
STENT URET 6FRX24 CONTOUR (STENTS) ×2 IMPLANT
TUBING CONNECTING 10 (TUBING) ×2 IMPLANT

## 2017-03-12 NOTE — Anesthesia Postprocedure Evaluation (Signed)
Anesthesia Post Note  Patient: Felicia Guerrero  Procedure(s) Performed: CYSTOSCOPY/URETEROSCOPY/HOLMIUM LASER/STENT PLACEMENT (Left Ureter)     Patient location during evaluation: PACU Anesthesia Type: General Level of consciousness: sedated Pain management: pain level controlled Vital Signs Assessment: post-procedure vital signs reviewed and stable Respiratory status: spontaneous breathing and respiratory function stable Cardiovascular status: stable Postop Assessment: no apparent nausea or vomiting Anesthetic complications: no    Last Vitals:  Vitals:   03/12/17 1830 03/12/17 1845  BP: (!) 186/98 (!) 175/91  Pulse: 92 86  Resp: (!) 8 11  Temp: 36.8 C   SpO2: 99% 99%    Last Pain:  Vitals:   03/12/17 1845  TempSrc:   PainSc: 2                  Felicia Guerrero DANIEL

## 2017-03-12 NOTE — Discharge Instructions (Signed)

## 2017-03-12 NOTE — Transfer of Care (Signed)
Immediate Anesthesia Transfer of Care Note  Patient: Felicia Guerrero  Procedure(s) Performed: CYSTOSCOPY/URETEROSCOPY/HOLMIUM LASER/STENT PLACEMENT (Left Ureter)  Patient Location: PACU  Anesthesia Type:General  Level of Consciousness: awake, alert  and oriented  Airway & Oxygen Therapy: Patient Spontanous Breathing and Patient connected to face mask oxygen  Post-op Assessment: Report given to RN and Post -op Vital signs reviewed and stable  Post vital signs: Reviewed and stable  Last Vitals:  Vitals:   03/12/17 1047  BP: (!) 163/94  Pulse: 85  Resp: 14  Temp: 36.9 C  SpO2: 100%    Last Pain:  Vitals:   03/12/17 1119  TempSrc:   PainSc: 0-No pain         Complications: No apparent anesthesia complications

## 2017-03-12 NOTE — Op Note (Signed)
Preoperative diagnosis: Left proximal ureteral and renal calculi  Postoperative diagnosis: Left proximal ureteral and renal calculi  Procedures: 1.  Cystoscopy 2.  Left ureteroscopy 3.  Left ureteroscopic laser lithotripsy 4.  Left ureteral stent placement (6 x 24 with no string) 5.  Left retrograde pyelography with interpretation  Surgeon: Pryor Curia MD  Anesthesia: General  Complications: None  EBL: Minimal  Intraoperative findings: Left retrograde pyelography was performed with Omnipaque contrast via the ureteroscope.  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  Specimens: Left renal calculi  Disposition of specimens: [facility]  Indication: Felicia Guerrero is a 47 year old with a long-standing history of recurrent urolithiasis who presented with a symptomatic left ureteral stone and developing sepsis recently.  She was managed with ureteral stent placement and appropriate antibiotic therapy.  She follows up today after resolution of her infection to proceed with definitive therapy.  After reviewing options for management, she elected ureteroscopic laser lithotripsy and the above procedures.  We reviewed the potential risks, complications, and the expected recovery process.  She gave informed consent to proceed.  Description of procedure: The patient was taken to the operating room and a general anesthetic was administered.  She was given preoperative antibiotics, placed in the dorsal lithotomy position, and prepped and draped in the usual sterile fashion.  Next, a preoperative timeout was performed.  Cystourethroscopy was then performed.  Her indwelling left ureteral stent was identified and was brought out to the urethral meatus with flexible graspers.  A 0.38 Sensor guidewire was then advanced up into the left ureter through the stent under  fluoroscopic guidance.  The stent was removed.  I first examined the ureter with a semirigid 6 French ureteroscope.  This allowed complete visualization of the entire left ureter up to the renal collecting system.  No ureteral calculi were identified.  A 12/14 ureteral access sheath was then advanced over the wire into the proximal ureter.  I then used the digital flexible ureteroscope to examine the left renal collecting system.  In the upper pole moiety of her bifid collecting system, 2 sizable stones were identified and were able to be removed intact through the access sheath without having to fragment them.  No other calculi were seen in the upper pole.  Attention then turned to the lower pole.  Multiple smaller yet still significant calculi were noted in the lower pole moiety.  Using a 200 m holmium laser fiber, the stones were fragmented and dusted on a setting of 0.5 J and 20 Hz.  Once the stone fragments were all quite small, attention turned to ureteral stent placement.  The flexible ureteroscope and access sheath were removed leaving the safety wire in place.  This was then backloaded on the cystoscope.  A 6 x 24 double-J ureteral stent was then advanced over the wire using Seldinger technique and positioned appropriately under fluoroscopic and cystoscopic guidance.  The wire was then removed with a good curl noted in the renal collecting system as well as in the bladder.  The patient's bladder was emptied.  She tolerated the procedure well and without complications.  She was able to be awakened and transferred to the recovery unit in satisfactory condition.

## 2017-03-12 NOTE — Interval H&P Note (Signed)
History and Physical Interval Note:  03/12/2017 11:40 AM  Felicia Guerrero  has presented today for surgery, with the diagnosis of LEFT URETERAL AND RENAL CALCULI  The various methods of treatment have been discussed with the patient and family. After consideration of risks, benefits and other options for treatment, the patient has consented to  Procedure(s): CYSTOSCOPY/URETEROSCOPY/HOLMIUM LASER/STENT PLACEMENT (Left) as a surgical intervention .  The patient's history has been reviewed, patient examined, no change in status, stable for surgery.  I have reviewed the patient's chart and labs.  Questions were answered to the patient's satisfaction.     Cosme Jacob,LES

## 2017-03-12 NOTE — Anesthesia Procedure Notes (Signed)
Procedure Name: LMA Insertion Performed by: Olivene Cookston J, CRNA Pre-anesthesia Checklist: Patient identified, Emergency Drugs available, Suction available, Patient being monitored and Timeout performed Patient Re-evaluated:Patient Re-evaluated prior to induction Oxygen Delivery Method: Circle system utilized Preoxygenation: Pre-oxygenation with 100% oxygen Induction Type: IV induction Ventilation: Mask ventilation without difficulty LMA: LMA inserted LMA Size: 4.0 Number of attempts: 1 Placement Confirmation: positive ETCO2,  CO2 detector and breath sounds checked- equal and bilateral Tube secured with: Tape Dental Injury: Teeth and Oropharynx as per pre-operative assessment        

## 2017-03-12 NOTE — Anesthesia Preprocedure Evaluation (Signed)
Anesthesia Evaluation  Patient identified by MRN, date of birth, ID band Patient awake    Reviewed: Allergy & Precautions, NPO status , Patient's Chart, lab work & pertinent test results  Airway Mallampati: II  TM Distance: >3 FB Neck ROM: Full    Dental no notable dental hx.    Pulmonary neg pulmonary ROS,    Pulmonary exam normal breath sounds clear to auscultation       Cardiovascular hypertension, Pt. on medications Normal cardiovascular exam Rhythm:Regular Rate:Normal     Neuro/Psych CVA, Residual Symptoms negative psych ROS   GI/Hepatic negative GI ROS, Neg liver ROS,   Endo/Other  diabetes  Renal/GU negative Renal ROS  negative genitourinary   Musculoskeletal negative musculoskeletal ROS (+)   Abdominal   Peds negative pediatric ROS (+)  Hematology negative hematology ROS (+)   Anesthesia Other Findings   Reproductive/Obstetrics negative OB ROS                             Anesthesia Physical Anesthesia Plan  ASA: III  Anesthesia Plan: General   Post-op Pain Management:    Induction: Intravenous  PONV Risk Score and Plan: 4 or greater and Ondansetron, Dexamethasone, Midazolam, Scopolamine patch - Pre-op and Treatment may vary due to age or medical condition  Airway Management Planned: LMA  Additional Equipment:   Intra-op Plan:   Post-operative Plan: Extubation in OR  Informed Consent: I have reviewed the patients History and Physical, chart, labs and discussed the procedure including the risks, benefits and alternatives for the proposed anesthesia with the patient or authorized representative who has indicated his/her understanding and acceptance.   Dental advisory given  Plan Discussed with: CRNA  Anesthesia Plan Comments:         Anesthesia Quick Evaluation

## 2017-03-13 ENCOUNTER — Encounter (HOSPITAL_COMMUNITY): Payer: Self-pay | Admitting: Urology

## 2018-08-23 ENCOUNTER — Other Ambulatory Visit (HOSPITAL_COMMUNITY): Payer: Self-pay | Admitting: *Deleted

## 2018-08-24 ENCOUNTER — Other Ambulatory Visit: Payer: Self-pay

## 2018-08-24 ENCOUNTER — Encounter (HOSPITAL_COMMUNITY)
Admission: RE | Admit: 2018-08-24 | Discharge: 2018-08-24 | Disposition: A | Discharge status: Home / Self Care | Payer: BC Managed Care – PPO | Source: Ambulatory Visit | Attending: Nephrology | Admitting: Nephrology

## 2018-08-24 DIAGNOSIS — D631 Anemia in chronic kidney disease: Secondary | ICD-10-CM | POA: Insufficient documentation

## 2018-08-24 DIAGNOSIS — N189 Chronic kidney disease, unspecified: Secondary | ICD-10-CM | POA: Diagnosis present

## 2018-08-24 MED ORDER — SODIUM CHLORIDE 0.9 % IV SOLN
510.0000 mg | INTRAVENOUS | Status: DC
  Administered 2018-08-24: 10:00:00 510 mg via INTRAVENOUS
  Filled 2018-08-24: qty 510

## 2018-08-24 NOTE — Discharge Instructions (Signed)

## 2018-08-31 ENCOUNTER — Other Ambulatory Visit: Payer: Self-pay

## 2018-08-31 ENCOUNTER — Ambulatory Visit (HOSPITAL_COMMUNITY)
Admission: RE | Admit: 2018-08-31 | Discharge: 2018-08-31 | Disposition: A | Discharge status: Home / Self Care | Payer: BC Managed Care – PPO | Source: Ambulatory Visit | Attending: Nephrology | Admitting: Nephrology

## 2018-08-31 DIAGNOSIS — D631 Anemia in chronic kidney disease: Secondary | ICD-10-CM | POA: Insufficient documentation

## 2018-08-31 DIAGNOSIS — N189 Chronic kidney disease, unspecified: Secondary | ICD-10-CM | POA: Diagnosis not present

## 2018-08-31 MED ORDER — SODIUM CHLORIDE 0.9 % IV SOLN
510.0000 mg | INTRAVENOUS | Status: DC
  Administered 2018-08-31: 510 mg via INTRAVENOUS
  Filled 2018-08-31: qty 17

## 2018-12-28 ENCOUNTER — Other Ambulatory Visit (HOSPITAL_COMMUNITY): Payer: Self-pay | Admitting: *Deleted

## 2018-12-29 ENCOUNTER — Other Ambulatory Visit: Payer: Self-pay

## 2018-12-29 ENCOUNTER — Ambulatory Visit (HOSPITAL_COMMUNITY)
Admission: RE | Admit: 2018-12-29 | Discharge: 2018-12-29 | Disposition: A | Discharge status: Home / Self Care | Payer: BC Managed Care – PPO | Source: Ambulatory Visit | Attending: Nephrology | Admitting: Nephrology

## 2018-12-29 DIAGNOSIS — N183 Chronic kidney disease, stage 3 unspecified: Secondary | ICD-10-CM | POA: Insufficient documentation

## 2018-12-29 DIAGNOSIS — D631 Anemia in chronic kidney disease: Secondary | ICD-10-CM | POA: Insufficient documentation

## 2018-12-29 MED ORDER — EPOETIN ALFA-EPBX 10000 UNIT/ML IJ SOLN
20000.0000 [IU] | Freq: Once | INTRAMUSCULAR | Status: AC
  Administered 2018-12-29: 09:00:00 20000 [IU] via SUBCUTANEOUS
  Filled 2018-12-29: qty 2

## 2018-12-29 NOTE — Discharge Instructions (Signed)

## 2018-12-30 LAB — POCT HEMOGLOBIN-HEMACUE: Hemoglobin: 8.4 g/dL — ABNORMAL LOW (ref 12.0–15.0)

## 2019-01-11 ENCOUNTER — Other Ambulatory Visit (HOSPITAL_COMMUNITY): Payer: Self-pay | Admitting: *Deleted

## 2019-01-12 ENCOUNTER — Encounter (HOSPITAL_COMMUNITY)
Admission: RE | Admit: 2019-01-12 | Discharge: 2019-01-12 | Disposition: A | Discharge status: Home / Self Care | Payer: BC Managed Care – PPO | Source: Ambulatory Visit | Attending: Nephrology | Admitting: Nephrology

## 2019-01-12 VITALS — BP 141/82 | HR 78 | Temp 96.5°F | Resp 18

## 2019-01-12 DIAGNOSIS — N289 Disorder of kidney and ureter, unspecified: Secondary | ICD-10-CM | POA: Diagnosis present

## 2019-01-12 LAB — POCT HEMOGLOBIN-HEMACUE: Hemoglobin: 8.3 g/dL — ABNORMAL LOW (ref 12.0–15.0)

## 2019-01-12 MED ORDER — EPOETIN ALFA-EPBX 10000 UNIT/ML IJ SOLN
20000.0000 [IU] | INTRAMUSCULAR | Status: DC
  Administered 2019-01-12: 20000 [IU] via SUBCUTANEOUS
  Filled 2019-01-12: qty 2

## 2019-01-26 ENCOUNTER — Other Ambulatory Visit: Payer: Self-pay

## 2019-01-26 ENCOUNTER — Ambulatory Visit (HOSPITAL_COMMUNITY)
Admission: RE | Admit: 2019-01-26 | Discharge: 2019-01-26 | Disposition: A | Discharge status: Home / Self Care | Payer: BC Managed Care – PPO | Source: Ambulatory Visit | Attending: Nephrology | Admitting: Nephrology

## 2019-01-26 VITALS — BP 115/69 | HR 100 | Temp 97.3°F | Resp 16 | Ht 65.75 in | Wt 135.0 lb

## 2019-01-26 DIAGNOSIS — N289 Disorder of kidney and ureter, unspecified: Secondary | ICD-10-CM | POA: Diagnosis present

## 2019-01-26 LAB — IRON AND TIBC
Iron: 30 ug/dL (ref 28–170)
Saturation Ratios: 16 % (ref 10.4–31.8)
TIBC: 192 ug/dL — ABNORMAL LOW (ref 250–450)
UIBC: 162 ug/dL

## 2019-01-26 LAB — RENAL FUNCTION PANEL
Albumin: 3.3 g/dL — ABNORMAL LOW (ref 3.5–5.0)
Anion gap: 18 — ABNORMAL HIGH (ref 5–15)
BUN: 79 mg/dL — ABNORMAL HIGH (ref 6–20)
CO2: 24 mmol/L (ref 22–32)
Calcium: 9.7 mg/dL (ref 8.9–10.3)
Chloride: 96 mmol/L — ABNORMAL LOW (ref 98–111)
Creatinine, Ser: 4.77 mg/dL — ABNORMAL HIGH (ref 0.44–1.00)
GFR calc Af Amer: 12 mL/min — ABNORMAL LOW (ref >60)
GFR calc non Af Amer: 10 mL/min — ABNORMAL LOW (ref >60)
Glucose, Bld: 249 mg/dL — ABNORMAL HIGH (ref 70–99)
Phosphorus: 5.6 mg/dL — ABNORMAL HIGH (ref 2.5–4.6)
Potassium: 3.8 mmol/L (ref 3.5–5.1)
Sodium: 138 mmol/L (ref 135–145)

## 2019-01-26 LAB — FERRITIN: Ferritin: 1389 ng/mL — ABNORMAL HIGH (ref 11–307)

## 2019-01-26 LAB — POCT HEMOGLOBIN-HEMACUE: Hemoglobin: 9.6 g/dL — ABNORMAL LOW (ref 12.0–15.0)

## 2019-01-26 MED ORDER — EPOETIN ALFA-EPBX 10000 UNIT/ML IJ SOLN
20000.0000 [IU] | INTRAMUSCULAR | Status: DC
  Administered 2019-01-26: 20000 [IU] via SUBCUTANEOUS
  Filled 2019-01-26: qty 2

## 2019-02-09 ENCOUNTER — Ambulatory Visit (HOSPITAL_COMMUNITY)
Admission: RE | Admit: 2019-02-09 | Discharge: 2019-02-09 | Disposition: A | Discharge status: Home / Self Care | Payer: BC Managed Care – PPO | Source: Ambulatory Visit | Attending: Nephrology | Admitting: Nephrology

## 2019-02-09 ENCOUNTER — Other Ambulatory Visit: Payer: Self-pay

## 2019-02-09 VITALS — BP 112/57 | HR 86 | Temp 97.2°F | Resp 18

## 2019-02-09 DIAGNOSIS — N289 Disorder of kidney and ureter, unspecified: Secondary | ICD-10-CM | POA: Diagnosis present

## 2019-02-09 LAB — POCT HEMOGLOBIN-HEMACUE: Hemoglobin: 10 g/dL — ABNORMAL LOW (ref 12.0–15.0)

## 2019-02-09 MED ORDER — EPOETIN ALFA-EPBX 10000 UNIT/ML IJ SOLN
20000.0000 [IU] | INTRAMUSCULAR | Status: DC
  Administered 2019-02-09: 20000 [IU] via SUBCUTANEOUS

## 2019-02-09 MED ORDER — EPOETIN ALFA-EPBX 10000 UNIT/ML IJ SOLN
INTRAMUSCULAR | Status: AC
  Filled 2019-02-09: qty 2

## 2019-02-23 ENCOUNTER — Ambulatory Visit (HOSPITAL_COMMUNITY)
Admission: RE | Admit: 2019-02-23 | Discharge: 2019-02-23 | Disposition: A | Discharge status: Home / Self Care | Payer: BC Managed Care – PPO | Source: Ambulatory Visit | Attending: Nephrology | Admitting: Nephrology

## 2019-02-23 ENCOUNTER — Other Ambulatory Visit: Payer: Self-pay

## 2019-02-23 VITALS — BP 152/81 | HR 76 | Resp 18

## 2019-02-23 DIAGNOSIS — N289 Disorder of kidney and ureter, unspecified: Secondary | ICD-10-CM

## 2019-02-23 LAB — POCT HEMOGLOBIN-HEMACUE: Hemoglobin: 9.3 g/dL — ABNORMAL LOW (ref 12.0–15.0)

## 2019-02-23 MED ORDER — EPOETIN ALFA-EPBX 10000 UNIT/ML IJ SOLN
20000.0000 [IU] | INTRAMUSCULAR | Status: DC
  Administered 2019-02-23: 20000 [IU] via SUBCUTANEOUS

## 2019-02-23 MED ORDER — EPOETIN ALFA-EPBX 10000 UNIT/ML IJ SOLN
INTRAMUSCULAR | Status: AC
  Filled 2019-02-23: qty 2

## 2019-03-09 ENCOUNTER — Ambulatory Visit (HOSPITAL_COMMUNITY)
Admission: RE | Admit: 2019-03-09 | Discharge: 2019-03-09 | Disposition: A | Discharge status: Home / Self Care | Payer: BC Managed Care – PPO | Source: Ambulatory Visit | Attending: Nephrology | Admitting: Nephrology

## 2019-03-09 ENCOUNTER — Other Ambulatory Visit: Payer: Self-pay

## 2019-03-09 VITALS — BP 169/85 | HR 87 | Temp 97.2°F | Resp 18

## 2019-03-09 DIAGNOSIS — N289 Disorder of kidney and ureter, unspecified: Secondary | ICD-10-CM

## 2019-03-09 LAB — RENAL FUNCTION PANEL
Albumin: 3.4 g/dL — ABNORMAL LOW (ref 3.5–5.0)
Anion gap: 14 (ref 5–15)
BUN: 61 mg/dL — ABNORMAL HIGH (ref 6–20)
CO2: 28 mmol/L (ref 22–32)
Calcium: 9.6 mg/dL (ref 8.9–10.3)
Chloride: 98 mmol/L (ref 98–111)
Creatinine, Ser: 3.88 mg/dL — ABNORMAL HIGH (ref 0.44–1.00)
GFR calc Af Amer: 15 mL/min — ABNORMAL LOW (ref >60)
GFR calc non Af Amer: 13 mL/min — ABNORMAL LOW (ref >60)
Glucose, Bld: 144 mg/dL — ABNORMAL HIGH (ref 70–99)
Phosphorus: 6.1 mg/dL — ABNORMAL HIGH (ref 2.5–4.6)
Potassium: 4.6 mmol/L (ref 3.5–5.1)
Sodium: 140 mmol/L (ref 135–145)

## 2019-03-09 LAB — IRON AND TIBC
Iron: 37 ug/dL (ref 28–170)
Saturation Ratios: 16 % (ref 10.4–31.8)
TIBC: 235 ug/dL — ABNORMAL LOW (ref 250–450)
UIBC: 198 ug/dL

## 2019-03-09 LAB — FERRITIN: Ferritin: 229 ng/mL (ref 11–307)

## 2019-03-09 LAB — POCT HEMOGLOBIN-HEMACUE: Hemoglobin: 9.8 g/dL — ABNORMAL LOW (ref 12.0–15.0)

## 2019-03-09 MED ORDER — EPOETIN ALFA-EPBX 10000 UNIT/ML IJ SOLN: 20000.0000 [IU] | INTRAMUSCULAR | Status: DC

## 2019-03-09 MED ORDER — EPOETIN ALFA-EPBX 10000 UNIT/ML IJ SOLN
INTRAMUSCULAR | Status: AC
  Administered 2019-03-09: 20000 [IU] via SUBCUTANEOUS
  Filled 2019-03-09: qty 2

## 2019-03-22 ENCOUNTER — Other Ambulatory Visit (HOSPITAL_COMMUNITY): Payer: Self-pay | Admitting: *Deleted

## 2019-03-23 ENCOUNTER — Ambulatory Visit (HOSPITAL_COMMUNITY)
Admission: RE | Admit: 2019-03-23 | Discharge: 2019-03-23 | Disposition: A | Discharge status: Home / Self Care | Payer: BC Managed Care – PPO | Source: Ambulatory Visit | Attending: Nephrology | Admitting: Nephrology

## 2019-03-23 ENCOUNTER — Other Ambulatory Visit: Payer: Self-pay

## 2019-03-23 ENCOUNTER — Encounter (HOSPITAL_COMMUNITY): Payer: BC Managed Care – PPO

## 2019-03-23 VITALS — BP 111/58 | HR 89 | Temp 97.3°F | Resp 18 | Ht 65.75 in | Wt 131.0 lb

## 2019-03-23 DIAGNOSIS — N289 Disorder of kidney and ureter, unspecified: Secondary | ICD-10-CM | POA: Diagnosis not present

## 2019-03-23 LAB — POCT HEMOGLOBIN-HEMACUE: Hemoglobin: 9.7 g/dL — ABNORMAL LOW (ref 12.0–15.0)

## 2019-03-23 MED ORDER — EPOETIN ALFA-EPBX 10000 UNIT/ML IJ SOLN
INTRAMUSCULAR | Status: AC
  Administered 2019-03-23: 20000 [IU] via SUBCUTANEOUS
  Filled 2019-03-23: qty 2

## 2019-03-23 MED ORDER — SODIUM CHLORIDE 0.9 % IV SOLN
510.0000 mg | INTRAVENOUS | Status: DC
  Administered 2019-03-23: 510 mg via INTRAVENOUS
  Filled 2019-03-23: qty 17

## 2019-03-23 MED ORDER — EPOETIN ALFA-EPBX 10000 UNIT/ML IJ SOLN: 20000.0000 [IU] | INTRAMUSCULAR | Status: DC

## 2019-03-30 ENCOUNTER — Ambulatory Visit (HOSPITAL_COMMUNITY)
Admission: RE | Admit: 2019-03-30 | Discharge: 2019-03-30 | Disposition: A | Discharge status: Home / Self Care | Payer: BC Managed Care – PPO | Source: Ambulatory Visit | Attending: Nephrology | Admitting: Nephrology

## 2019-03-30 ENCOUNTER — Other Ambulatory Visit: Payer: Self-pay

## 2019-03-30 VITALS — BP 125/66 | HR 81 | Temp 97.3°F | Resp 18 | Ht 65.0 in | Wt 131.0 lb

## 2019-03-30 DIAGNOSIS — N289 Disorder of kidney and ureter, unspecified: Secondary | ICD-10-CM | POA: Diagnosis present

## 2019-03-30 LAB — POCT HEMOGLOBIN-HEMACUE: Hemoglobin: 10 g/dL — ABNORMAL LOW (ref 12.0–15.0)

## 2019-03-30 MED ORDER — EPOETIN ALFA-EPBX 10000 UNIT/ML IJ SOLN: 20000.0000 [IU] | INTRAMUSCULAR | Status: DC

## 2019-03-30 MED ORDER — SODIUM CHLORIDE 0.9 % IV SOLN
510.0000 mg | INTRAVENOUS | Status: DC
  Administered 2019-03-30: 510 mg via INTRAVENOUS
  Filled 2019-03-30: qty 17

## 2019-04-06 ENCOUNTER — Other Ambulatory Visit: Payer: Self-pay

## 2019-04-06 ENCOUNTER — Encounter (HOSPITAL_COMMUNITY)
Admission: RE | Admit: 2019-04-06 | Discharge: 2019-04-06 | Disposition: A | Discharge status: Home / Self Care | Payer: BC Managed Care – PPO | Source: Ambulatory Visit | Attending: Nephrology | Admitting: Nephrology

## 2019-04-06 VITALS — BP 132/79 | HR 83 | Temp 96.4°F | Resp 18

## 2019-04-06 DIAGNOSIS — N289 Disorder of kidney and ureter, unspecified: Secondary | ICD-10-CM | POA: Diagnosis present

## 2019-04-06 LAB — RENAL FUNCTION PANEL
Albumin: 3.2 g/dL — ABNORMAL LOW (ref 3.5–5.0)
Anion gap: 16 — ABNORMAL HIGH (ref 5–15)
BUN: 72 mg/dL — ABNORMAL HIGH (ref 6–20)
CO2: 26 mmol/L (ref 22–32)
Calcium: 9.6 mg/dL (ref 8.9–10.3)
Chloride: 96 mmol/L — ABNORMAL LOW (ref 98–111)
Creatinine, Ser: 4.35 mg/dL — ABNORMAL HIGH (ref 0.44–1.00)
GFR calc Af Amer: 13 mL/min — ABNORMAL LOW (ref >60)
GFR calc non Af Amer: 11 mL/min — ABNORMAL LOW (ref >60)
Glucose, Bld: 200 mg/dL — ABNORMAL HIGH (ref 70–99)
Phosphorus: 5.1 mg/dL — ABNORMAL HIGH (ref 2.5–4.6)
Potassium: 4.6 mmol/L (ref 3.5–5.1)
Sodium: 138 mmol/L (ref 135–145)

## 2019-04-06 LAB — POCT HEMOGLOBIN-HEMACUE: Hemoglobin: 9.8 g/dL — ABNORMAL LOW (ref 12.0–15.0)

## 2019-04-06 LAB — IRON AND TIBC
Iron: 44 ug/dL (ref 28–170)
Saturation Ratios: 22 % (ref 10.4–31.8)
TIBC: 203 ug/dL — ABNORMAL LOW (ref 250–450)
UIBC: 159 ug/dL

## 2019-04-06 LAB — FERRITIN: Ferritin: 835 ng/mL — ABNORMAL HIGH (ref 11–307)

## 2019-04-06 MED ORDER — EPOETIN ALFA-EPBX 10000 UNIT/ML IJ SOLN
20000.0000 [IU] | INTRAMUSCULAR | Status: DC
  Administered 2019-04-06: 20000 [IU] via SUBCUTANEOUS

## 2019-04-06 MED ORDER — EPOETIN ALFA-EPBX 10000 UNIT/ML IJ SOLN
INTRAMUSCULAR | Status: AC
  Filled 2019-04-06: qty 2

## 2019-04-20 ENCOUNTER — Other Ambulatory Visit: Payer: Self-pay

## 2019-04-20 ENCOUNTER — Encounter (HOSPITAL_COMMUNITY)
Admission: RE | Admit: 2019-04-20 | Discharge: 2019-04-20 | Disposition: A | Discharge status: Home / Self Care | Payer: BC Managed Care – PPO | Source: Ambulatory Visit | Attending: Nephrology | Admitting: Nephrology

## 2019-04-20 DIAGNOSIS — N289 Disorder of kidney and ureter, unspecified: Secondary | ICD-10-CM

## 2019-04-20 LAB — POCT HEMOGLOBIN-HEMACUE: Hemoglobin: 10.4 g/dL — ABNORMAL LOW (ref 12.0–15.0)

## 2019-04-20 MED ORDER — EPOETIN ALFA-EPBX 10000 UNIT/ML IJ SOLN
20000.0000 [IU] | INTRAMUSCULAR | Status: DC
  Administered 2019-04-20: 20000 [IU] via SUBCUTANEOUS

## 2019-04-20 MED ORDER — EPOETIN ALFA-EPBX 10000 UNIT/ML IJ SOLN
INTRAMUSCULAR | Status: AC
  Filled 2019-04-20: qty 2

## 2019-05-04 ENCOUNTER — Ambulatory Visit (HOSPITAL_COMMUNITY)
Admission: RE | Admit: 2019-05-04 | Discharge: 2019-05-04 | Disposition: A | Discharge status: Home / Self Care | Payer: BC Managed Care – PPO | Source: Ambulatory Visit | Attending: Nephrology | Admitting: Nephrology

## 2019-05-04 ENCOUNTER — Other Ambulatory Visit: Payer: Self-pay

## 2019-05-04 VITALS — BP 166/77 | HR 85 | Temp 95.0°F | Resp 18

## 2019-05-04 DIAGNOSIS — N289 Disorder of kidney and ureter, unspecified: Secondary | ICD-10-CM

## 2019-05-04 LAB — RENAL FUNCTION PANEL
Albumin: 3.5 g/dL (ref 3.5–5.0)
Anion gap: 19 — ABNORMAL HIGH (ref 5–15)
BUN: 75 mg/dL — ABNORMAL HIGH (ref 6–20)
CO2: 25 mmol/L (ref 22–32)
Calcium: 9.6 mg/dL (ref 8.9–10.3)
Chloride: 93 mmol/L — ABNORMAL LOW (ref 98–111)
Creatinine, Ser: 4 mg/dL — ABNORMAL HIGH (ref 0.44–1.00)
GFR calc Af Amer: 14 mL/min — ABNORMAL LOW (ref >60)
GFR calc non Af Amer: 12 mL/min — ABNORMAL LOW (ref >60)
Glucose, Bld: 224 mg/dL — ABNORMAL HIGH (ref 70–99)
Phosphorus: 6.1 mg/dL — ABNORMAL HIGH (ref 2.5–4.6)
Potassium: 4.4 mmol/L (ref 3.5–5.1)
Sodium: 137 mmol/L (ref 135–145)

## 2019-05-04 LAB — POCT HEMOGLOBIN-HEMACUE: Hemoglobin: 11.5 g/dL — ABNORMAL LOW (ref 12.0–15.0)

## 2019-05-04 LAB — FERRITIN: Ferritin: 438 ng/mL — ABNORMAL HIGH (ref 11–307)

## 2019-05-04 LAB — IRON AND TIBC
Iron: 44 ug/dL (ref 28–170)
Saturation Ratios: 18 % (ref 10.4–31.8)
TIBC: 238 ug/dL — ABNORMAL LOW (ref 250–450)
UIBC: 194 ug/dL

## 2019-05-04 MED ORDER — EPOETIN ALFA-EPBX 10000 UNIT/ML IJ SOLN
INTRAMUSCULAR | Status: AC
  Filled 2019-05-04: qty 2

## 2019-05-04 MED ORDER — EPOETIN ALFA-EPBX 10000 UNIT/ML IJ SOLN
20000.0000 [IU] | INTRAMUSCULAR | Status: DC
  Administered 2019-05-04: 20000 [IU] via SUBCUTANEOUS

## 2019-05-17 ENCOUNTER — Other Ambulatory Visit (HOSPITAL_COMMUNITY): Payer: Self-pay

## 2019-05-18 ENCOUNTER — Other Ambulatory Visit: Payer: Self-pay

## 2019-05-18 ENCOUNTER — Ambulatory Visit (HOSPITAL_COMMUNITY)
Admission: RE | Admit: 2019-05-18 | Discharge: 2019-05-18 | Disposition: A | Discharge status: Home / Self Care | Payer: BC Managed Care – PPO | Source: Ambulatory Visit | Attending: Nephrology | Admitting: Nephrology

## 2019-05-18 VITALS — BP 154/81 | HR 67 | Temp 96.3°F | Resp 18

## 2019-05-18 DIAGNOSIS — N289 Disorder of kidney and ureter, unspecified: Secondary | ICD-10-CM | POA: Diagnosis not present

## 2019-05-18 LAB — POCT HEMOGLOBIN-HEMACUE: Hemoglobin: 13.5 g/dL (ref 12.0–15.0)

## 2019-05-18 MED ORDER — EPOETIN ALFA-EPBX 10000 UNIT/ML IJ SOLN: 20000.0000 [IU] | INTRAMUSCULAR | Status: DC

## 2019-05-18 MED ORDER — SODIUM CHLORIDE 0.9 % IV SOLN
510.0000 mg | INTRAVENOUS | Status: DC
  Administered 2019-05-18: 510 mg via INTRAVENOUS
  Filled 2019-05-18: qty 17

## 2019-06-01 ENCOUNTER — Encounter (HOSPITAL_COMMUNITY)
Admission: RE | Admit: 2019-06-01 | Discharge: 2019-06-01 | Disposition: A | Discharge status: Home / Self Care | Payer: BC Managed Care – PPO | Source: Ambulatory Visit | Attending: Nephrology | Admitting: Nephrology

## 2019-06-01 ENCOUNTER — Other Ambulatory Visit: Payer: Self-pay

## 2019-06-01 VITALS — BP 130/78 | HR 79 | Temp 96.5°F | Resp 18 | Ht 65.0 in | Wt 135.0 lb

## 2019-06-01 DIAGNOSIS — N289 Disorder of kidney and ureter, unspecified: Secondary | ICD-10-CM | POA: Diagnosis present

## 2019-06-01 LAB — POCT HEMOGLOBIN-HEMACUE: Hemoglobin: 12.2 g/dL (ref 12.0–15.0)

## 2019-06-01 MED ORDER — EPOETIN ALFA-EPBX 10000 UNIT/ML IJ SOLN: 20000.0000 [IU] | INTRAMUSCULAR | Status: DC

## 2019-06-01 MED ORDER — SODIUM CHLORIDE 0.9 % IV SOLN
510.0000 mg | INTRAVENOUS | Status: AC
  Administered 2019-06-01: 510 mg via INTRAVENOUS
  Filled 2019-06-01: qty 17

## 2019-06-15 ENCOUNTER — Encounter (HOSPITAL_COMMUNITY): Payer: BC Managed Care – PPO

## 2019-06-17 ENCOUNTER — Other Ambulatory Visit: Payer: Self-pay

## 2019-06-17 ENCOUNTER — Encounter (HOSPITAL_COMMUNITY)
Admission: RE | Admit: 2019-06-17 | Discharge: 2019-06-17 | Disposition: A | Discharge status: Home / Self Care | Payer: BC Managed Care – PPO | Source: Ambulatory Visit | Attending: Nephrology | Admitting: Nephrology

## 2019-06-17 VITALS — BP 148/83 | HR 86 | Temp 97.1°F | Resp 18

## 2019-06-17 DIAGNOSIS — N289 Disorder of kidney and ureter, unspecified: Secondary | ICD-10-CM | POA: Diagnosis not present

## 2019-06-17 LAB — RENAL FUNCTION PANEL
Albumin: 3.7 g/dL (ref 3.5–5.0)
Anion gap: 11 (ref 5–15)
BUN: 62 mg/dL — ABNORMAL HIGH (ref 6–20)
CO2: 30 mmol/L (ref 22–32)
Calcium: 9.7 mg/dL (ref 8.9–10.3)
Chloride: 99 mmol/L (ref 98–111)
Creatinine, Ser: 3.53 mg/dL — ABNORMAL HIGH (ref 0.44–1.00)
GFR calc Af Amer: 17 mL/min — ABNORMAL LOW (ref >60)
GFR calc non Af Amer: 14 mL/min — ABNORMAL LOW (ref >60)
Glucose, Bld: 159 mg/dL — ABNORMAL HIGH (ref 70–99)
Phosphorus: 4.5 mg/dL (ref 2.5–4.6)
Potassium: 4.5 mmol/L (ref 3.5–5.1)
Sodium: 140 mmol/L (ref 135–145)

## 2019-06-17 LAB — IRON AND TIBC
Iron: 152 ug/dL (ref 28–170)
Saturation Ratios: 62 % — ABNORMAL HIGH (ref 10.4–31.8)
TIBC: 244 ug/dL — ABNORMAL LOW (ref 250–450)
UIBC: 92 ug/dL

## 2019-06-17 LAB — POCT HEMOGLOBIN-HEMACUE: Hemoglobin: 11.5 g/dL — ABNORMAL LOW (ref 12.0–15.0)

## 2019-06-17 LAB — FERRITIN: Ferritin: 921 ng/mL — ABNORMAL HIGH (ref 11–307)

## 2019-06-17 MED ORDER — EPOETIN ALFA-EPBX 10000 UNIT/ML IJ SOLN
INTRAMUSCULAR | Status: AC
  Filled 2019-06-17: qty 2

## 2019-06-17 MED ORDER — EPOETIN ALFA-EPBX 10000 UNIT/ML IJ SOLN
20000.0000 [IU] | INTRAMUSCULAR | Status: DC
  Administered 2019-06-17: 20000 [IU] via SUBCUTANEOUS

## 2019-07-15 ENCOUNTER — Ambulatory Visit (HOSPITAL_COMMUNITY)
Admission: RE | Admit: 2019-07-15 | Discharge: 2019-07-15 | Disposition: A | Discharge status: Home / Self Care | Payer: BC Managed Care – PPO | Source: Ambulatory Visit | Attending: Nephrology | Admitting: Nephrology

## 2019-07-15 ENCOUNTER — Other Ambulatory Visit: Payer: Self-pay

## 2019-07-15 VITALS — BP 162/77 | HR 70 | Temp 96.6°F | Resp 18

## 2019-07-15 DIAGNOSIS — N289 Disorder of kidney and ureter, unspecified: Secondary | ICD-10-CM | POA: Diagnosis present

## 2019-07-15 LAB — RENAL FUNCTION PANEL
Albumin: 3.9 g/dL (ref 3.5–5.0)
Anion gap: 12 (ref 5–15)
BUN: 56 mg/dL — ABNORMAL HIGH (ref 6–20)
CO2: 27 mmol/L (ref 22–32)
Calcium: 9.4 mg/dL (ref 8.9–10.3)
Chloride: 98 mmol/L (ref 98–111)
Creatinine, Ser: 3.54 mg/dL — ABNORMAL HIGH (ref 0.44–1.00)
GFR calc Af Amer: 17 mL/min — ABNORMAL LOW (ref >60)
GFR calc non Af Amer: 14 mL/min — ABNORMAL LOW (ref >60)
Glucose, Bld: 121 mg/dL — ABNORMAL HIGH (ref 70–99)
Phosphorus: 5.3 mg/dL — ABNORMAL HIGH (ref 2.5–4.6)
Potassium: 4.6 mmol/L (ref 3.5–5.1)
Sodium: 137 mmol/L (ref 135–145)

## 2019-07-15 LAB — FERRITIN: Ferritin: 623 ng/mL — ABNORMAL HIGH (ref 11–307)

## 2019-07-15 LAB — IRON AND TIBC
Iron: 113 ug/dL (ref 28–170)
Saturation Ratios: 44 % — ABNORMAL HIGH (ref 10.4–31.8)
TIBC: 255 ug/dL (ref 250–450)
UIBC: 142 ug/dL

## 2019-07-15 LAB — POCT HEMOGLOBIN-HEMACUE: Hemoglobin: 12 g/dL (ref 12.0–15.0)

## 2019-07-15 MED ORDER — EPOETIN ALFA-EPBX 10000 UNIT/ML IJ SOLN: 20000.0000 [IU] | INTRAMUSCULAR | Status: DC

## 2019-07-27 ENCOUNTER — Other Ambulatory Visit: Payer: Self-pay

## 2019-07-27 ENCOUNTER — Encounter (HOSPITAL_COMMUNITY)
Admission: RE | Admit: 2019-07-27 | Discharge: 2019-07-27 | Disposition: A | Discharge status: Home / Self Care | Payer: BC Managed Care – PPO | Source: Ambulatory Visit | Attending: Nephrology | Admitting: Nephrology

## 2019-07-27 VITALS — BP 155/80 | HR 89 | Temp 95.8°F | Resp 18

## 2019-07-27 DIAGNOSIS — N289 Disorder of kidney and ureter, unspecified: Secondary | ICD-10-CM | POA: Diagnosis not present

## 2019-07-27 LAB — POCT HEMOGLOBIN-HEMACUE: Hemoglobin: 10.5 g/dL — ABNORMAL LOW (ref 12.0–15.0)

## 2019-07-27 MED ORDER — EPOETIN ALFA-EPBX 10000 UNIT/ML IJ SOLN
INTRAMUSCULAR | Status: AC
  Filled 2019-07-27: qty 2

## 2019-07-27 MED ORDER — EPOETIN ALFA-EPBX 10000 UNIT/ML IJ SOLN
20000.0000 [IU] | INTRAMUSCULAR | Status: DC
  Administered 2019-07-27: 20000 [IU] via SUBCUTANEOUS

## 2019-08-05 ENCOUNTER — Encounter (HOSPITAL_COMMUNITY): Payer: BC Managed Care – PPO

## 2019-08-23 ENCOUNTER — Encounter (HOSPITAL_COMMUNITY)
Admission: RE | Admit: 2019-08-23 | Discharge: 2019-08-23 | Disposition: A | Discharge status: Home / Self Care | Payer: BC Managed Care – PPO | Source: Ambulatory Visit | Attending: Nephrology | Admitting: Nephrology

## 2019-08-23 ENCOUNTER — Other Ambulatory Visit: Payer: Self-pay

## 2019-08-23 VITALS — BP 168/91 | HR 86 | Temp 97.5°F

## 2019-08-23 DIAGNOSIS — N289 Disorder of kidney and ureter, unspecified: Secondary | ICD-10-CM | POA: Diagnosis not present

## 2019-08-23 LAB — RENAL FUNCTION PANEL
Albumin: 4 g/dL (ref 3.5–5.0)
Anion gap: 15 (ref 5–15)
BUN: 70 mg/dL — ABNORMAL HIGH (ref 6–20)
CO2: 26 mmol/L (ref 22–32)
Calcium: 9.7 mg/dL (ref 8.9–10.3)
Chloride: 96 mmol/L — ABNORMAL LOW (ref 98–111)
Creatinine, Ser: 3.94 mg/dL — ABNORMAL HIGH (ref 0.44–1.00)
GFR calc Af Amer: 15 mL/min — ABNORMAL LOW (ref >60)
GFR calc non Af Amer: 13 mL/min — ABNORMAL LOW (ref >60)
Glucose, Bld: 223 mg/dL — ABNORMAL HIGH (ref 70–99)
Phosphorus: 4.8 mg/dL — ABNORMAL HIGH (ref 2.5–4.6)
Potassium: 4.4 mmol/L (ref 3.5–5.1)
Sodium: 137 mmol/L (ref 135–145)

## 2019-08-23 LAB — POCT HEMOGLOBIN-HEMACUE: Hemoglobin: 12.1 g/dL (ref 12.0–15.0)

## 2019-08-23 LAB — IRON AND TIBC
Iron: 114 ug/dL (ref 28–170)
Saturation Ratios: 44 % — ABNORMAL HIGH (ref 10.4–31.8)
TIBC: 260 ug/dL (ref 250–450)
UIBC: 146 ug/dL

## 2019-08-23 LAB — FERRITIN: Ferritin: 654 ng/mL — ABNORMAL HIGH (ref 11–307)

## 2019-08-23 MED ORDER — EPOETIN ALFA-EPBX 10000 UNIT/ML IJ SOLN: 20000.0000 [IU] | INTRAMUSCULAR | Status: DC

## 2019-08-24 ENCOUNTER — Encounter (HOSPITAL_COMMUNITY): Payer: BC Managed Care – PPO

## 2019-09-06 ENCOUNTER — Encounter (HOSPITAL_COMMUNITY)
Admission: RE | Admit: 2019-09-06 | Discharge: 2019-09-06 | Disposition: A | Discharge status: Home / Self Care | Payer: BC Managed Care – PPO | Source: Ambulatory Visit | Attending: Nephrology | Admitting: Nephrology

## 2019-09-06 ENCOUNTER — Other Ambulatory Visit: Payer: Self-pay

## 2019-09-06 VITALS — BP 110/67 | HR 80 | Temp 97.1°F | Resp 18

## 2019-09-06 DIAGNOSIS — N289 Disorder of kidney and ureter, unspecified: Secondary | ICD-10-CM | POA: Diagnosis not present

## 2019-09-06 LAB — POCT HEMOGLOBIN-HEMACUE: Hemoglobin: 11.1 g/dL — ABNORMAL LOW (ref 12.0–15.0)

## 2019-09-06 MED ORDER — EPOETIN ALFA-EPBX 10000 UNIT/ML IJ SOLN
20000.0000 [IU] | INTRAMUSCULAR | Status: DC
  Administered 2019-09-06: 20000 [IU] via SUBCUTANEOUS

## 2019-09-06 MED ORDER — EPOETIN ALFA-EPBX 10000 UNIT/ML IJ SOLN
INTRAMUSCULAR | Status: AC
  Filled 2019-09-06: qty 2

## 2019-09-20 ENCOUNTER — Encounter (HOSPITAL_COMMUNITY): Payer: BC Managed Care – PPO

## 2019-10-04 ENCOUNTER — Ambulatory Visit (HOSPITAL_COMMUNITY)
Admission: RE | Admit: 2019-10-04 | Discharge: 2019-10-04 | Disposition: A | Discharge status: Home / Self Care | Payer: BC Managed Care – PPO | Source: Ambulatory Visit | Attending: Nephrology | Admitting: Nephrology

## 2019-10-04 ENCOUNTER — Other Ambulatory Visit: Payer: Self-pay

## 2019-10-04 VITALS — BP 164/84 | HR 79 | Temp 97.6°F | Resp 18

## 2019-10-04 DIAGNOSIS — N289 Disorder of kidney and ureter, unspecified: Secondary | ICD-10-CM | POA: Diagnosis present

## 2019-10-04 LAB — RENAL FUNCTION PANEL
Albumin: 4.2 g/dL (ref 3.5–5.0)
Anion gap: 15 (ref 5–15)
BUN: 112 mg/dL — ABNORMAL HIGH (ref 6–20)
CO2: 24 mmol/L (ref 22–32)
Calcium: 10 mg/dL (ref 8.9–10.3)
Chloride: 98 mmol/L (ref 98–111)
Creatinine, Ser: 4.85 mg/dL — ABNORMAL HIGH (ref 0.44–1.00)
GFR calc Af Amer: 11 mL/min — ABNORMAL LOW (ref >60)
GFR calc non Af Amer: 10 mL/min — ABNORMAL LOW (ref >60)
Glucose, Bld: 250 mg/dL — ABNORMAL HIGH (ref 70–99)
Phosphorus: 5 mg/dL — ABNORMAL HIGH (ref 2.5–4.6)
Potassium: 3.5 mmol/L (ref 3.5–5.1)
Sodium: 137 mmol/L (ref 135–145)

## 2019-10-04 LAB — POCT HEMOGLOBIN-HEMACUE: Hemoglobin: 10.2 g/dL — ABNORMAL LOW (ref 12.0–15.0)

## 2019-10-04 LAB — IRON AND TIBC
Iron: 122 ug/dL (ref 28–170)
Saturation Ratios: 49 % — ABNORMAL HIGH (ref 10.4–31.8)
TIBC: 251 ug/dL (ref 250–450)
UIBC: 129 ug/dL

## 2019-10-04 LAB — FERRITIN: Ferritin: 685 ng/mL — ABNORMAL HIGH (ref 11–307)

## 2019-10-04 MED ORDER — EPOETIN ALFA-EPBX 10000 UNIT/ML IJ SOLN: 20000.0000 [IU] | INTRAMUSCULAR | Status: DC

## 2019-10-04 MED ORDER — EPOETIN ALFA-EPBX 10000 UNIT/ML IJ SOLN
INTRAMUSCULAR | Status: AC
  Administered 2019-10-04: 20000 [IU] via SUBCUTANEOUS
  Filled 2019-10-04: qty 2

## 2019-11-01 ENCOUNTER — Other Ambulatory Visit: Payer: Self-pay

## 2019-11-01 ENCOUNTER — Ambulatory Visit (HOSPITAL_COMMUNITY)
Admission: RE | Admit: 2019-11-01 | Discharge: 2019-11-01 | Disposition: A | Discharge status: Home / Self Care | Payer: BC Managed Care – PPO | Source: Ambulatory Visit | Attending: Nephrology | Admitting: Nephrology

## 2019-11-01 VITALS — BP 122/62 | HR 79 | Temp 98.0°F | Resp 18

## 2019-11-01 DIAGNOSIS — N289 Disorder of kidney and ureter, unspecified: Secondary | ICD-10-CM | POA: Diagnosis present

## 2019-11-01 LAB — RENAL FUNCTION PANEL
Albumin: 3.8 g/dL (ref 3.5–5.0)
Anion gap: 13 (ref 5–15)
BUN: 93 mg/dL — ABNORMAL HIGH (ref 6–20)
CO2: 24 mmol/L (ref 22–32)
Calcium: 9.4 mg/dL (ref 8.9–10.3)
Chloride: 99 mmol/L (ref 98–111)
Creatinine, Ser: 5.07 mg/dL — ABNORMAL HIGH (ref 0.44–1.00)
GFR calc Af Amer: 11 mL/min — ABNORMAL LOW (ref >60)
GFR calc non Af Amer: 9 mL/min — ABNORMAL LOW (ref >60)
Glucose, Bld: 195 mg/dL — ABNORMAL HIGH (ref 70–99)
Phosphorus: 4.8 mg/dL — ABNORMAL HIGH (ref 2.5–4.6)
Potassium: 4.5 mmol/L (ref 3.5–5.1)
Sodium: 136 mmol/L (ref 135–145)

## 2019-11-01 LAB — POCT HEMOGLOBIN-HEMACUE: Hemoglobin: 10.1 g/dL — ABNORMAL LOW (ref 12.0–15.0)

## 2019-11-01 LAB — FERRITIN: Ferritin: 442 ng/mL — ABNORMAL HIGH (ref 11–307)

## 2019-11-01 LAB — IRON AND TIBC
Iron: 84 ug/dL (ref 28–170)
Saturation Ratios: 31 % (ref 10.4–31.8)
TIBC: 272 ug/dL (ref 250–450)
UIBC: 188 ug/dL

## 2019-11-01 MED ORDER — EPOETIN ALFA-EPBX 10000 UNIT/ML IJ SOLN: 20000.0000 [IU] | INTRAMUSCULAR | Status: DC

## 2019-11-01 MED ORDER — EPOETIN ALFA-EPBX 10000 UNIT/ML IJ SOLN
INTRAMUSCULAR | Status: AC
  Administered 2019-11-01: 20000 [IU] via SUBCUTANEOUS
  Filled 2019-11-01: qty 2

## 2019-11-22 ENCOUNTER — Other Ambulatory Visit: Payer: Self-pay | Admitting: Urology

## 2019-11-23 NOTE — Progress Notes (Addendum)
COVID Vaccine Completed:  x2 Date COVID Vaccine completed: 05-01-19 & 05-23-19 2nd dose COVID vaccine manufacturer: Pfizer    Moderna   [employer]   PCP - Precious Haws, MD Cardiologist - Celine Ahr, MD  Chest x-ray -  EKG - 11-24-19 in Epic Stress Test -  ECHO - 08-03-18 Care Everywhere Cardiac Cath -  Pacemaker/ICD device last checked: Holter Monitor - 05-18-18 Care Everywhere  Sleep Study - 01-04-18 Care Everywhere CPAP -   Fasting Blood Sugar -  Checks Blood Sugar - does not check.  Blood sugar 282 at PAT visit.  Last meal at 10:15, juice and cereal.  Diabetic meds recently changed.  Blood Thinner Instructions: Aspirin Instructions:  ASA 81 mg.  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 Heart murmur.  Hx of mini stroke.  Tachycardia   Patient denies shortness of breath, fever, cough and chest pain at PAT appointment   Patient verbalized understanding of instructions that were given to them at the PAT appointment. Patient was also instructed that they will need to review over the PAT instructions again at home before surgery.

## 2019-11-23 NOTE — Patient Instructions (Addendum)
DUE TO COVID-19 ONLY ONE VISITOR IS ALLOWED TO COME WITH YOU AND STAY IN THE WAITING ROOM ONLY  DURING PRE OP AND PROCEDURE.   IF YOU WILL BE ADMITTED INTO THE HOSPITAL YOU ARE ALLOWED ONE SUPPORT PERSON DURING  VISITATION HOURS ONLY (10AM -8PM)   . The support person may change daily. . The support person must pass our screening, gel in and out, and wear a mask at all times, including in the patient's room. . Patients must also wear a mask when staff or their support person are in the room.   COVID SWAB TESTING MUST BE COMPLETED ON:  Thursday, 11-24-19   [address] [place], [place] [address]  (Must self quarantine after testing. Follow instructions on  handout.)    Your procedure is scheduled on: Monday, 11-28-19   Report to [facility] Main  Entrance   Report to admitting at 3:00 PM   Call this number if you have problems the morning of surgery [phone]   Do not eat food :After Midnight.   May have liquids until 2:00 PM day of surgery  CLEAR LIQUID DIET  Foods Allowed                                                                     Foods Excluded  Water, Black Coffee and tea, regular and decaf              liquids that you cannot  Plain Jell-O in any flavor  (No red)                                     see through such as: Fruit ices (not with fruit pulp)                                      milk, soups, orange juice              Iced Popsicles (No red)                                      All solid food                                   Apple juices Sports drinks like Gatorade (No red) Lightly seasoned clear broth or consume(fat free) Sugar, honey syrup     Oral Hygiene is also important to reduce your risk of infection.                                    Remember - BRUSH YOUR TEETH THE MORNING OF SURGERY WITH YOUR REGULAR TOOTHPASTE   Do NOT smoke after Midnight   Take these medicines the morning of surgery with A SIP OF WATER:  Nifedipine, Oxycodone  if needed  How to Manage Your Diabetes Before and After Surgery  Why is it important to control my blood sugar before and after surgery? . Improving blood sugar levels before and after surgery helps healing and can limit problems. . A way of improving blood sugar control is eating a healthy diet by: o  Eating less sugar and carbohydrates o  Increasing activity/exercise o  Talking with your doctor about reaching your blood sugar goals . High blood sugars (greater than 180 mg/dL) can raise your risk of infections and slow your recovery, so you will need to focus on controlling your diabetes during the weeks before surgery. . Make sure that the doctor who takes care of your diabetes knows about your planned surgery including the date and location.  How do I manage my blood sugar before surgery? . Check your blood sugar at least 4 times a day, starting 2 days before surgery, to make sure that the level is not too high or low. o Check your blood sugar the morning of your surgery when you wake up and every 2 hours until you get to the Short Stay unit. . If your blood sugar is less than 70 mg/dL, you will need to treat for low blood sugar: o Do not take insulin. o Treat a low blood sugar (less than 70 mg/dL) with  cup of clear juice (cranberry or apple), 4 glucose tablets, OR glucose gel. o Recheck blood sugar in 15 minutes after treatment (to make sure it is greater than 70 mg/dL). If your blood sugar is not greater than 70 mg/dL on recheck, call [phone] for further instructions. . Report your blood sugar to the short stay nurse when you get to Short Stay.  . If you are admitted to the hospital after surgery: o Your blood sugar will be checked by the staff and you will probably be given insulin after surgery (instead of oral diabetes medicines) to make sure you have good blood sugar levels. o The goal for blood sugar control after surgery is 80-180 mg/dL.   WHAT DO I DO ABOUT MY DIABETES  MEDICATION?  Marland Kitchen Do not take oral diabetes medicines (pills) the morning of surgery.  . THE DAY BEFORE SURGERY:  Janumet as prescribed. Do 50% of Tresiba night before surgery      . THE MORNING OF SURGERY:  Do not take Janumet.   Reviewed and Endorsed by [facility] Patient Education Committee, August 2015                               You may not have any metal on your body including hair pins, jewelry, and body piercings             Do not wear make-up, lotions, powders, perfumes/cologne, or deodorant             Do not wear nail polish.  Do not shave  48 hours prior to surgery.                  Do not bring valuables to the hospital. [place].   Contacts, dentures or bridgework may not be worn into surgery.     Patients discharged the day of surgery will not be allowed to drive home.                Please read over the following fact sheets you were given: IF YOU HAVE QUESTIONS ABOUT YOUR PRE OP INSTRUCTIONS PLEASE  CALL [phone]   [facility] - Preparing for Surgery Before surgery, you can play an important role.  Because skin is not sterile, your skin needs to be as free of germs as possible.  You can reduce the number of germs on your skin by washing with CHG (chlorahexidine gluconate) soap before surgery.  CHG is an antiseptic cleaner which kills germs and bonds with the skin to continue killing germs even after washing. Please DO NOT use if you have an allergy to CHG or antibacterial soaps.  If your skin becomes reddened/irritated stop using the CHG and inform your nurse when you arrive at Short Stay. Do not shave (including legs and underarms) for at least 48 hours prior to the first CHG shower.  You may shave your face/neck.  Please follow these instructions carefully:  1.  Shower with CHG Soap the night before surgery and the  morning of surgery.  2.  If you choose to wash your hair, wash your hair first as usual with your normal   shampoo.  3.  After you shampoo, rinse your hair and body thoroughly to remove the shampoo.                             4.  Use CHG as you would any other liquid soap.  You can apply chg directly to the skin and wash.  Gently with a scrungie or clean washcloth.  5.  Apply the CHG Soap to your body ONLY FROM THE NECK DOWN.   Do   not use on face/ open                           Wound or open sores. Avoid contact with eyes, ears mouth and   genitals (private parts).                       Wash face,  Genitals (private parts) with your normal soap.             6.  Wash thoroughly, paying special attention to the area where your    surgery  will be performed.  7.  Thoroughly rinse your body with warm water from the neck down.  8.  DO NOT shower/wash with your normal soap after using and rinsing off the CHG Soap.                9.  Pat yourself dry with a clean towel.            10.  Wear clean pajamas.            11.  Place clean sheets on your bed the night of your first shower and do not  sleep with pets. Day of Surgery : Do not apply any lotions/deodorants the morning of surgery.  Please wear clean clothes to the hospital/surgery center.  FAILURE TO FOLLOW THESE INSTRUCTIONS MAY RESULT IN THE CANCELLATION OF YOUR SURGERY  PATIENT SIGNATURE_________________________________  NURSE SIGNATURE__________________________________  ________________________________________________________________________

## 2019-11-24 ENCOUNTER — Other Ambulatory Visit: Payer: Self-pay

## 2019-11-24 ENCOUNTER — Encounter (HOSPITAL_COMMUNITY): Payer: Self-pay

## 2019-11-24 ENCOUNTER — Encounter (HOSPITAL_COMMUNITY)
Admission: RE | Admit: 2019-11-24 | Discharge: 2019-11-24 | Disposition: A | Discharge status: Home / Self Care | Payer: BC Managed Care – PPO | Source: Ambulatory Visit | Attending: Urology | Admitting: Urology

## 2019-11-24 ENCOUNTER — Other Ambulatory Visit (HOSPITAL_COMMUNITY)
Admission: RE | Admit: 2019-11-24 | Discharge: 2019-11-24 | Disposition: A | Discharge status: Home / Self Care | Payer: BC Managed Care – PPO | Source: Ambulatory Visit | Attending: Urology | Admitting: Urology

## 2019-11-24 DIAGNOSIS — Z20822 Contact with and (suspected) exposure to covid-19: Secondary | ICD-10-CM | POA: Diagnosis not present

## 2019-11-24 DIAGNOSIS — Z01818 Encounter for other preprocedural examination: Secondary | ICD-10-CM | POA: Diagnosis present

## 2019-11-24 DIAGNOSIS — I10 Essential (primary) hypertension: Secondary | ICD-10-CM | POA: Diagnosis not present

## 2019-11-24 HISTORY — DX: Cardiac murmur, unspecified: R01.1

## 2019-11-24 HISTORY — DX: Pneumothorax, unspecified: J93.9

## 2019-11-24 LAB — CBC
HCT: 33 % — ABNORMAL LOW (ref 36.0–46.0)
Hemoglobin: 10.4 g/dL — ABNORMAL LOW (ref 12.0–15.0)
MCH: 30.3 pg (ref 26.0–34.0)
MCHC: 31.5 g/dL (ref 30.0–36.0)
MCV: 96.2 fL (ref 80.0–100.0)
Platelets: 293 10*3/uL (ref 150–400)
RBC: 3.43 MIL/uL — ABNORMAL LOW (ref 3.87–5.11)
RDW: 12.9 % (ref 11.5–15.5)
WBC: 5.7 10*3/uL (ref 4.0–10.5)
nRBC: 0 % (ref 0.0–0.2)

## 2019-11-24 LAB — SARS CORONAVIRUS 2 (TAT 6-24 HRS): SARS Coronavirus 2: NEGATIVE

## 2019-11-24 LAB — GLUCOSE, CAPILLARY: Glucose-Capillary: 282 mg/dL — ABNORMAL HIGH (ref 70–99)

## 2019-11-24 LAB — BASIC METABOLIC PANEL
Anion gap: 14 (ref 5–15)
BUN: 94 mg/dL — ABNORMAL HIGH (ref 6–20)
CO2: 27 mmol/L (ref 22–32)
Calcium: 9.9 mg/dL (ref 8.9–10.3)
Chloride: 95 mmol/L — ABNORMAL LOW (ref 98–111)
Creatinine, Ser: 4.14 mg/dL — ABNORMAL HIGH (ref 0.44–1.00)
GFR calc Af Amer: 14 mL/min — ABNORMAL LOW (ref >60)
GFR calc non Af Amer: 12 mL/min — ABNORMAL LOW (ref >60)
Glucose, Bld: 264 mg/dL — ABNORMAL HIGH (ref 70–99)
Potassium: 4.1 mmol/L (ref 3.5–5.1)
Sodium: 136 mmol/L (ref 135–145)

## 2019-11-24 LAB — HEMOGLOBIN A1C
Hgb A1c MFr Bld: 8.7 % — ABNORMAL HIGH (ref 4.8–5.6)
Mean Plasma Glucose: 202.99 mg/dL

## 2019-11-24 NOTE — Progress Notes (Signed)
BMP results sent to Dr. Alinda Money to review

## 2019-11-25 NOTE — H&P (Signed)
Office Visit Report     11/17/2019   --------------------------------------------------------------------------------   Felicia Guerrero  MRN: [id number]  DOB: April 27, 1970, 49 year old Female  SSN:    PRIMARY CARE:    REFERRING:    PROVIDER:  Raynelle Bring, M.D.  TREATING:  Daine Gravel, NP  LOCATION:  [facility] [phone]     --------------------------------------------------------------------------------   CC/HPI: Bilateral renal calculi   Felicia Guerrero presents today for routine follow-up of her kidney stones. Unfortunately, her renal function has worsened. Her most recent serum creatinine was 5.07 with an estimated GFR of 11. She is starting to have discussions with Dr. Moshe Cipro about a possible renal transplant evaluation. She has denied any recent hematuria or passage of any stones. She denies any flank pain today. She continues to be very compliant with her hydration. She follows up today with a KUB x-ray.   11/17/19: Felicia Guerrero is a very pleasant 49 year old female who has the above noted past medical history. She presents today with sudden onset of left flank pain, increased frequency, urgency and nausea. This began one day ago. She denies fevers, chills, dysuria and gross hematuria. She has not seen a stone pass.     ALLERGIES: Cephalexin - Diarrhea    MEDICATIONS: Aspirin 81 mg tablet,chewable  Lisinopril  Chlorthalidone 25 mg tablet  Janumet  Nifedipine  Rosuvastatin Calcium  Tresiba     GU PSH: Cysto Remove Stent FB Sim - 2019 Cystoscopy Insert Stent, Left - 2018 Hysterectomy Ureteroscopic laser litho, Left - 2019     NON-GU PSH: Eye Surgery Procedure, Right     GU PMH: Renal calculus - 11/09/2019, - 01/25/2018 Microscopic hematuria - 02/09/2018 Ureteral calculus - 2019      PMH Notes:   1) Urolithiasis: She has a long history of recurrent stone disease and had undergone multiple procedures in [place] over the years before presented to  me in the ED in December 2018 after having moved to [place].   Dec 2018: L ureteral stent for obstructing stone and sepsis  Jan 2019: L ureteroscopic laser lithotripsy  Feb 2019: 24 hr urine - Low volume, hypocitraturia, hyperoxaluria (SS CaOx not high) - improve hydration   2) CKD: Her baseline Cr is 2.4. She is followed by Dr. Moshe Cipro at [facility].   NON-GU PMH: Diabetes Type 2 Hypercholesterolemia Hypertension Stroke/TIA    FAMILY HISTORY: None    Notes: 2 daughters   SOCIAL HISTORY: Marital Status: Single Preferred Language: English; Ethnicity: Not Hispanic Or Latino; Race: Black or African American Current Smoking Status: Patient has never smoked.   Tobacco Use Assessment Completed: Used Tobacco in last 30 days? Does not drink anymore.  Drinks 1 caffeinated drink per day.    REVIEW OF SYSTEMS:    GU Review Female:   Patient reports frequent urination. Patient denies hard to postpone urination, burning /pain with urination, get up at night to urinate, leakage of urine, stream starts and stops, trouble starting your stream, have to strain to urinate, and being pregnant.  Gastrointestinal (Upper):   Patient reports nausea. Patient denies indigestion/ heartburn and vomiting.  Gastrointestinal (Lower):   Patient denies diarrhea and constipation.  Constitutional:   Patient reports fatigue. Patient denies fever, night sweats, and weight loss.  Skin:   Patient denies skin rash/ lesion and itching.  Eyes:   Patient denies blurred vision and double vision.  Ears/ Nose/ Throat:   Patient denies sore throat and sinus problems.  Hematologic/Lymphatic:  Patient denies swollen glands and easy bruising.  Cardiovascular:   Patient denies leg swelling and chest pains.  Respiratory:   Patient denies cough and shortness of breath.  Endocrine:   Patient denies excessive thirst.  Musculoskeletal:   Patient denies back pain and joint pain.  Neurological:   Patient  denies headaches and dizziness.  Psychologic:   Patient denies depression and anxiety.   Notes: LEft flank pain    VITAL SIGNS:      11/17/2019 01:05 PM  BP 127/71 mmHg  Pulse 109 /min  Temperature 97.7 F / 36.5 C   GU PHYSICAL EXAMINATION:      Notes: no CVA tenderness   MULTI-SYSTEM PHYSICAL EXAMINATION:    Constitutional: Thin. No physical deformities. Normally developed. Good grooming.   Respiratory: No labored breathing, no use of accessory muscles.   Cardiovascular: Normal temperature, normal extremity pulses, no swelling, no varicosities.  Skin: No paleness, no jaundice, no cyanosis. No lesion, no ulcer, no rash.  Neurologic / Psychiatric: Oriented to time, oriented to place, oriented to person. No depression, no anxiety, no agitation.  Musculoskeletal: Normal gait and station of head and neck.     PAST DATA REVIEW: None   PROCEDURES:         C.T. Urogram - [id number]      Patient confirmed No Neulasta OnPro Device.         Urinalysis w/Scope Dipstick Dipstick Cont'd Micro  Color: Yellow Bilirubin: Neg mg/dL WBC/hpf: 0 - 5/hpf  Appearance: Cloudy Ketones: Neg mg/dL RBC/hpf: 20 - 40/hpf  Specific Gravity: 1.015 Blood: 3+ ery/uL Bacteria: Rare (0-9/hpf)  pH: <=5.0 Protein: 1+ mg/dL Cystals: NS (Not Seen)  Glucose: Trace mg/dL Urobilinogen: 0.2 mg/dL Casts: NS (Not Seen)    Nitrites: Neg Trichomonas: Not Present    Leukocyte Esterase: Trace leu/uL Mucous: Not Present      Epithelial Cells: NS (Not Seen)      Yeast: NS (Not Seen)      Sperm: Not Present    ASSESSMENT:      ICD-10 Details  1 GU:   Renal calculus - N20.0 Bilateral, Chronic, Stable  2   Ureteral calculus - N20.1 Left, Acute, Systemic Symptoms   PLAN:            Medications New Meds: Doxycycline Hyclate 100 mg capsule 1 capsule PO BID   #14  0 Refill(s)  Tamsulosin Hcl 0.4 mg capsule 1 capsule PO Q HS   #20  0 Refill(s)  Hydrocodone-Acetaminophen 5 mg-325 mg tablet 1 tablet PO Q 6 H PRN   #20  0  Refill(s)  Ondansetron Hcl 4 mg tablet 1 tablet PO Q 6 H PRN nausea  #20  0 Refill(s)            Orders Labs CULTURE, URINE  X-Rays: C.T. Stone Protocol Without Contrast - Flank pain, nausea, increased urinary frequency  X-Ray Notes: History:  Hematuria: Yes/No  Patient to see MD after exam: Yes/No  Previous exam: CT / IVP/ US/ KUB/ None  When:  Where:  Diabetic: Yes/ No  BUN/ Creatine:  Date of last BUN Creatinine:  Weight in pounds:  Allergy- Contrasts/ Shellfish: Yes/ No  Conflicting diabetic meds: Yes/ No  Oral contrast and instructions given to patient:   Prior Authorization #: [id number] valid 11/17/19 thru 05/14/20            Schedule Labs: 1 Day - BUN/Creatinine(Stat)          Document Letter(s):  Created for  Patient: Clinical Summary         Notes:   Urine is not overly concerning for infectious process. I will send for precautionary culture. She has poor renal function and thereofre we decided a CT scan would be the most definitive option to rule out an obstructing stone causing her symptoms. CT scan is awaiting final radiology read. There is moderated hydronephrosis noted to the left kidney and ureter. Unfortunately, I am unable to apprecite where the stone is due to her lack of adipose. I will call her with results of CT scan once final read is ready. Advised she begin tamsulosin and straining all urine. Prescription for pain medication and nausea medication sent to pharmacy. She understands not to operate heavy machinery while taking this medication. She will return to clinic in the AM for a STAT BUN/Creatinine. I advised she not eat or drink anything doing this time until her renal function tests results. SHe may require a STENT placement if renal function has worsened. She verbalized understanding. SHe will need follow up on Monday and repeat labs if stent is not placed in AM.         Next Appointment:      Next Appointment: 11/17/2019 01:00 PM     Appointment Type: Office Visit Established Patient    Location: [facility] [phone] [id number]    Provider: Daine Gravel, NP    Reason for Visit: N/V possible Kidney stone.      * Signed by Daine Gravel, NP on 11/17/19 at 4:54 PM (EDT)*

## 2019-11-25 NOTE — Progress Notes (Signed)
A1c sent to Dr. Alinda Money to review

## 2019-11-25 NOTE — Progress Notes (Signed)
Anesthesia Chart Review   Case: [id number] Date/Time: 11/28/19 1650   Procedure: CYSTOSCOPY/RETROGRADE/URETEROSCOPY/HOLMIUM LASER/STENT PLACEMENT (Left )   Anesthesia type: General   Pre-op diagnosis: LEFT DISTAL STONE   Location: [facility]OM 03 / [facility]   Surgeons: Raynelle Bring, MD      DISCUSSION:49 y.o. never smoker with h/o HTN, DM II, CKD Stage 4 (creatinine stable, followed by [facility]), stroke with left sided residual blindness, left distal stone scheduled for above procedure 11/28/2019 with Dr. Raynelle Bring.   Last seen by PCP 06/16/2019.  Stable at this visit.   Followed by cardiology due to pericardial effusion and palpitations.  Last seen 01/26/2019.  Stable at this visit.    Echocardiogram 07/2018 - Normal LV EF 60-65%. No residual pericardial effusion, Mild 1+ mitral regurgitation. Moderate 2+ tricuspid regurgitation.   Anticipate pt can proceed with planned procedure barring acute status change.  Blood glucose to be evaluated DOS.  VS: BP 139/65   Pulse 85   Temp 37.2 C (Oral)   Resp 16   Ht 5\' 5"  (1.651 m)   Wt 61.2 kg   SpO2 100%   BMI 22.47 kg/m   PROVIDERS: Bartholome Bill, MD is PCP   Celine Ahr, MD is Cardiologist  LABS: Labs reviewed: Acceptable for surgery. (all labs ordered are listed, but only abnormal results are displayed)  Labs Reviewed  HEMOGLOBIN A1C - Abnormal; Notable for the following components:      Result Value   Hgb A1c MFr Bld 8.7 (*)    All other components within normal limits  BASIC METABOLIC PANEL - Abnormal; Notable for the following components:   Chloride 95 (*)    Glucose, Bld 264 (*)    BUN 94 (*)    Creatinine, Ser 4.14 (*)    GFR calc non Af Amer 12 (*)    GFR calc Af Amer 14 (*)    All other components within normal limits  CBC - Abnormal; Notable for the following components:   RBC 3.43 (*)    Hemoglobin 10.4 (*)    HCT 33.0 (*)    All other components within normal limits  GLUCOSE,  CAPILLARY - Abnormal; Notable for the following components:   Glucose-Capillary 282 (*)    All other components within normal limits     IMAGES:   EKG: 11/24/2019 Rate 79 bpm  Normal sinus rhythm Possible Left atrial enlargement Nonspecific T wave abnormality Abnormal ECG  CV:  Past Medical History:  Diagnosis Date  . Anemia    hx of  . Arthritis    right pinky osteoarthritis  . Chronic kidney disease    stage 3 ckd  . Depression    hx of  . Diabetes mellitus without complication ([place])    type 2  . Heart murmur   . History of kidney stones   . Hypertension   . Pneumothorax   . Stroke [facility])    short term memory loss and left side weakness /blindness right eye   Nov 28 2014    Past Surgical History:  Procedure Laterality Date  . CARDIAC SURGERY     For pericardial effusion  . CESAREAN SECTION    . CHEST TUBE INSERTION    . CYSTOSCOPY W/ URETERAL STENT PLACEMENT Left 02/15/2017   Procedure: CYSTOSCOPY WITH LEFT URETERAL STENT PLACEMENT;  Surgeon: Raynelle Bring, MD;  Location[facility];  Service: Urology;  Laterality: Left;  . CYSTOSCOPY/URETEROSCOPY/HOLMIUM LASER/STENT PLACEMENT Left 03/12/2017   Procedure: CYSTOSCOPY/URETEROSCOPY/HOLMIUM LASER/STENT  PLACEMENT;  Surgeon: Raynelle Bring, MD;  Location[facility];  Service: Urology;  Laterality: Left;  . EYE SURGERY     right eye  . HYSTERECTOMY ABDOMINAL WITH SALPINGECTOMY    . KIDNEY STONE SURGERY    . PERICARDIAL WINDOW    . WISDOM TOOTH EXTRACTION      MEDICATIONS: . acetaminophen (TYLENOL) 500 MG tablet  . aspirin 81 MG EC tablet  . chlorthalidone (HYGROTON) 25 MG tablet  . doxycycline (VIBRAMYCIN) 100 MG capsule  . HYDROcodone-acetaminophen (NORCO/VICODIN) 5-325 MG tablet  . Insulin Degludec (TRESIBA) 100 UNIT/ML SOLN  . lisinopril (ZESTRIL) 20 MG tablet  . Magnesium Oxide 420 MG TABS  . metFORMIN (GLUCOPHAGE-XR) 500 MG 24 hr tablet  . metoprolol succinate (TOPROL-XL) 50 MG 24 hr tablet  . Multiple  Vitamin (MULTIVITAMIN WITH MINERALS) TABS tablet  . NIFEdipine (ADALAT CC) 90 MG 24 hr tablet  . NIFEdipine (PROCARDIA XL/ADALAT-CC) 90 MG 24 hr tablet  . ondansetron (ZOFRAN) 4 MG tablet  . oxyCODONE-acetaminophen (ROXICET) 5-325 MG tablet  . prednisoLONE acetate (PRED FORTE) 1 % ophthalmic suspension  . rosuvastatin (CRESTOR) 20 MG tablet  . SitaGLIPtin-MetFORMIN HCl (JANUMET XR) 50-1000 MG TB24  . sulfamethoxazole-trimethoprim (BACTRIM DS,SEPTRA DS) 800-160 MG tablet  . tamsulosin (FLOMAX) 0.4 MG CAPS capsule   No current facility-administered medications for this encounter.    Konrad Felix, PA-C [facility] [phone]

## 2019-11-25 NOTE — Anesthesia Preprocedure Evaluation (Addendum)
Anesthesia Evaluation  Patient identified by MRN, date of birth, ID band Patient awake    Reviewed: Allergy & Precautions, NPO status , Patient's Chart, lab work & pertinent test results, reviewed documented beta blocker date and time   Airway Mallampati: I       Dental no notable dental hx. (+) Dental Advisory Given   Pulmonary    Pulmonary exam normal        Cardiovascular hypertension, Pt. on medications and Pt. on home beta blockers Normal cardiovascular exam     Neuro/Psych Depression CVA, Residual Symptoms    GI/Hepatic negative GI ROS, Neg liver ROS,   Endo/Other  diabetes, Type 2, Oral Hypoglycemic Agents  Renal/GU Renal InsufficiencyRenal diseaseCKD III     Musculoskeletal  (+) Arthritis , Osteoarthritis,    Abdominal Normal abdominal exam  (+)   Peds  Hematology  (+) Blood dyscrasia, anemia ,   Anesthesia Other Findings   Reproductive/Obstetrics                           Anesthesia Physical Anesthesia Plan  ASA: III  Anesthesia Plan: General   Post-op Pain Management:    Induction: Intravenous  PONV Risk Score and Plan: 4 or greater and Ondansetron, Midazolam and Treatment may vary due to age or medical condition  Airway Management Planned: LMA  Additional Equipment: None  Intra-op Plan:   Post-operative Plan: Extubation in OR  Informed Consent: I have reviewed the patients History and Physical, chart, labs and discussed the procedure including the risks, benefits and alternatives for the proposed anesthesia with the patient or authorized representative who has indicated his/her understanding and acceptance.     Dental advisory given  Plan Discussed with: CRNA  Anesthesia Plan Comments: (See PAT note 11/24/2019, Konrad Felix, PA-C)       Anesthesia Quick Evaluation

## 2019-11-28 ENCOUNTER — Ambulatory Visit (HOSPITAL_COMMUNITY)
Admission: RE | Admit: 2019-11-28 | Discharge: 2019-11-28 | Disposition: A | Discharge status: Home / Self Care | Payer: BC Managed Care – PPO | Source: Other Acute Inpatient Hospital | Attending: Urology | Admitting: Urology

## 2019-11-28 ENCOUNTER — Encounter (HOSPITAL_COMMUNITY): Payer: Self-pay | Admitting: Urology

## 2019-11-28 ENCOUNTER — Encounter (HOSPITAL_COMMUNITY)
Admission: RE | Disposition: A | Discharge status: Home / Self Care | Payer: Self-pay | Source: Other Acute Inpatient Hospital | Attending: Urology

## 2019-11-28 ENCOUNTER — Ambulatory Visit (HOSPITAL_COMMUNITY): Payer: BC Managed Care – PPO

## 2019-11-28 ENCOUNTER — Ambulatory Visit (HOSPITAL_COMMUNITY): Payer: BC Managed Care – PPO | Admitting: Certified Registered"

## 2019-11-28 ENCOUNTER — Other Ambulatory Visit: Payer: Self-pay

## 2019-11-28 ENCOUNTER — Ambulatory Visit (HOSPITAL_COMMUNITY): Payer: BC Managed Care – PPO | Admitting: Physician Assistant

## 2019-11-28 DIAGNOSIS — M1990 Unspecified osteoarthritis, unspecified site: Secondary | ICD-10-CM | POA: Diagnosis not present

## 2019-11-28 DIAGNOSIS — I10 Essential (primary) hypertension: Secondary | ICD-10-CM | POA: Insufficient documentation

## 2019-11-28 DIAGNOSIS — Z881 Allergy status to other antibiotic agents status: Secondary | ICD-10-CM | POA: Diagnosis not present

## 2019-11-28 DIAGNOSIS — Z8673 Personal history of transient ischemic attack (TIA), and cerebral infarction without residual deficits: Secondary | ICD-10-CM | POA: Insufficient documentation

## 2019-11-28 DIAGNOSIS — Z794 Long term (current) use of insulin: Secondary | ICD-10-CM | POA: Insufficient documentation

## 2019-11-28 DIAGNOSIS — Z79899 Other long term (current) drug therapy: Secondary | ICD-10-CM | POA: Diagnosis not present

## 2019-11-28 DIAGNOSIS — E119 Type 2 diabetes mellitus without complications: Secondary | ICD-10-CM | POA: Insufficient documentation

## 2019-11-28 DIAGNOSIS — E7800 Pure hypercholesterolemia, unspecified: Secondary | ICD-10-CM | POA: Diagnosis not present

## 2019-11-28 DIAGNOSIS — Z87442 Personal history of urinary calculi: Secondary | ICD-10-CM | POA: Diagnosis not present

## 2019-11-28 DIAGNOSIS — Z7982 Long term (current) use of aspirin: Secondary | ICD-10-CM | POA: Diagnosis not present

## 2019-11-28 DIAGNOSIS — N132 Hydronephrosis with renal and ureteral calculous obstruction: Secondary | ICD-10-CM | POA: Insufficient documentation

## 2019-11-28 DIAGNOSIS — N201 Calculus of ureter: Secondary | ICD-10-CM | POA: Diagnosis present

## 2019-11-28 HISTORY — PX: CYSTOSCOPY/URETEROSCOPY/HOLMIUM LASER/STENT PLACEMENT: SHX6546

## 2019-11-28 LAB — GLUCOSE, CAPILLARY
Glucose-Capillary: 121 mg/dL — ABNORMAL HIGH (ref 70–99)
Glucose-Capillary: 160 mg/dL — ABNORMAL HIGH (ref 70–99)

## 2019-11-28 SURGERY — CYSTOSCOPY/URETEROSCOPY/HOLMIUM LASER/STENT PLACEMENT
Anesthesia: General | Laterality: Left

## 2019-11-28 MED ORDER — ONDANSETRON HCL 4 MG/2ML IJ SOLN: 4.0000 mg | Freq: Once | INTRAMUSCULAR | Status: DC | PRN

## 2019-11-28 MED ORDER — IOHEXOL 300 MG/ML  SOLN
INTRAMUSCULAR | Status: DC | PRN
  Administered 2019-11-28: 5 mL

## 2019-11-28 MED ORDER — HYDROCODONE-ACETAMINOPHEN 7.5-325 MG PO TABS: 1.0000 | ORAL_TABLET | Freq: Once | ORAL | Status: DC | PRN

## 2019-11-28 MED ORDER — ONDANSETRON HCL 4 MG/2ML IJ SOLN
INTRAMUSCULAR | Status: DC | PRN
  Administered 2019-11-28: 4 mg via INTRAVENOUS

## 2019-11-28 MED ORDER — ACETAMINOPHEN 160 MG/5ML PO SOLN: 325.0000 mg | ORAL | Status: DC | PRN

## 2019-11-28 MED ORDER — PROPOFOL 10 MG/ML IV BOLUS
INTRAVENOUS | Status: DC | PRN
  Administered 2019-11-28: 170 mg via INTRAVENOUS

## 2019-11-28 MED ORDER — FENTANYL CITRATE (PF) 100 MCG/2ML IJ SOLN
INTRAMUSCULAR | Status: AC
  Filled 2019-11-28: qty 2

## 2019-11-28 MED ORDER — ACETAMINOPHEN 325 MG PO TABS: 325.0000 mg | ORAL_TABLET | ORAL | Status: DC | PRN

## 2019-11-28 MED ORDER — ACETAMINOPHEN 10 MG/ML IV SOLN: 1000.0000 mg | Freq: Once | INTRAVENOUS | Status: DC | PRN

## 2019-11-28 MED ORDER — LACTATED RINGERS IV SOLN: INTRAVENOUS | Status: DC

## 2019-11-28 MED ORDER — CHLORHEXIDINE GLUCONATE 0.12 % MT SOLN: 15.0000 mL | Freq: Once | OROMUCOSAL | Status: AC

## 2019-11-28 MED ORDER — SODIUM CHLORIDE 0.9 % IR SOLN
Status: DC | PRN
  Administered 2019-11-28: 3000 mL

## 2019-11-28 MED ORDER — MIDAZOLAM HCL 2 MG/2ML IJ SOLN
INTRAMUSCULAR | Status: AC
  Filled 2019-11-28: qty 2

## 2019-11-28 MED ORDER — CIPROFLOXACIN IN D5W 400 MG/200ML IV SOLN
400.0000 mg | INTRAVENOUS | Status: AC
  Administered 2019-11-28: 400 mg via INTRAVENOUS
  Filled 2019-11-28: qty 200

## 2019-11-28 MED ORDER — EPHEDRINE SULFATE-NACL 50-0.9 MG/10ML-% IV SOSY
PREFILLED_SYRINGE | INTRAVENOUS | Status: DC | PRN
  Administered 2019-11-28: 15 mg via INTRAVENOUS
  Administered 2019-11-28: 10 mg via INTRAVENOUS

## 2019-11-28 MED ORDER — MEPERIDINE HCL 50 MG/ML IJ SOLN: 6.2500 mg | INTRAMUSCULAR | Status: DC | PRN

## 2019-11-28 MED ORDER — LIDOCAINE 2% (20 MG/ML) 5 ML SYRINGE
INTRAMUSCULAR | Status: DC | PRN
  Administered 2019-11-28: 100 mg via INTRAVENOUS

## 2019-11-28 MED ORDER — FENTANYL CITRATE (PF) 100 MCG/2ML IJ SOLN: 25.0000 ug | INTRAMUSCULAR | Status: DC | PRN

## 2019-11-28 MED ORDER — ORAL CARE MOUTH RINSE
15.0000 mL | Freq: Once | OROMUCOSAL | Status: AC
  Administered 2019-11-28: 15 mL via OROMUCOSAL

## 2019-11-28 MED ORDER — FENTANYL CITRATE (PF) 100 MCG/2ML IJ SOLN
INTRAMUSCULAR | Status: DC | PRN
  Administered 2019-11-28 (×2): 50 ug via INTRAVENOUS

## 2019-11-28 MED ORDER — MIDAZOLAM HCL 2 MG/2ML IJ SOLN
INTRAMUSCULAR | Status: DC | PRN
  Administered 2019-11-28 (×2): 1 mg via INTRAVENOUS

## 2019-11-28 SURGICAL SUPPLY — 18 items
BAG URO CATCHER STRL LF (MISCELLANEOUS) ×2 IMPLANT
BASKET ZERO TIP NITINOL 2.4FR (BASKET) IMPLANT
BSKT STON RTRVL ZERO TP 2.4FR (BASKET)
CATH INTERMIT  6FR 70CM (CATHETERS) ×2 IMPLANT
CLOTH BEACON ORANGE TIMEOUT ST (SAFETY) ×2 IMPLANT
GLOVE BIOGEL M STRL SZ7.5 (GLOVE) ×2 IMPLANT
GOWN STRL REUS W/TWL LRG LVL3 (GOWN DISPOSABLE) ×2 IMPLANT
GUIDEWIRE STR DUAL SENSOR (WIRE) ×2 IMPLANT
GUIDEWIRE ZIPWRE .038 STRAIGHT (WIRE) IMPLANT
KIT TURNOVER KIT A (KITS) ×2 IMPLANT
LASER FIB FLEXIVA PULSE ID 365 (Laser) IMPLANT
MANIFOLD NEPTUNE II (INSTRUMENTS) ×2 IMPLANT
PACK CYSTO (CUSTOM PROCEDURE TRAY) ×2 IMPLANT
SHEATH URETERAL 12FRX35CM (MISCELLANEOUS) IMPLANT
TRACTIP FLEXIVA PULS ID 200XHI (Laser) IMPLANT
TRACTIP FLEXIVA PULSE ID 200 (Laser)
TUBING CONNECTING 10 (TUBING) ×2 IMPLANT
TUBING UROLOGY SET (TUBING) ×2 IMPLANT

## 2019-11-28 NOTE — Op Note (Signed)
Preoperative diagnosis: Left ureteral calculus  Postoperative diagnosis: History of left ureteral calculus  Procedure:  1. Cystoscopy 2. Left ureteroscopy 3. Left retrograde pyelography with interpretation  Surgeon: Pryor Curia. M.D.  Anesthesia: General  Complications: None  Intraoperative findings: Left retrograde pyelography demonstrated a questionable filling defect within the distal left ureter consistent with the patient's known calculus without other abnormalities.  EBL: Minimal  Indication: Felicia Guerrero is a 49 y.o. year old patient with urolithiasis. She was recently diagnosed with a symptomatic distal ureteral left sided stone.  She has not noted that she has passed her stone. After reviewing the management options for treatment, the patient elected to proceed with the above surgical procedure(s). We have discussed the potential benefits and risks of the procedure, side effects of the proposed treatment, the likelihood of the patient achieving the goals of the procedure, and any potential problems that might occur during the procedure or recuperation. Informed consent has been obtained.  Description of procedure:  The patient was taken to the operating room and general anesthesia was induced.  The patient was placed in the dorsal lithotomy position, prepped and draped in the usual sterile fashion, and preoperative antibiotics were administered. A preoperative time-out was performed.   Cystourethroscopy was performed.  The patient's urethra was examined and was normal. The bladder was then systematically examined in its entirety. There was no evidence for any bladder tumors, stones, or other mucosal pathology.    Attention then turned to the left ureteral orifice and a ureteral catheter was used to intubate the ureteral orifice.  Omnipaque contrast was injected through the ureteral catheter and a retrograde pyelogram was performed with findings as dictated  above.  A 0.38 sensor guidewire was then advanced up the left ureter into the renal pelvis under fluoroscopic guidance. The 6 Fr semirigid ureteroscope was then advanced up the ureter to the level of the UPJ.  No stone was identified indicating that she had likely passed it.  The bladder was then emptied and the procedure ended.  The patient appeared to tolerate the procedure well and without complications.  The patient was able to be awakened and transferred to the recovery unit in satisfactory condition.

## 2019-11-28 NOTE — Anesthesia Procedure Notes (Signed)
Procedure Name: LMA Insertion Date/Time: 11/28/2019 5:12 PM Performed by: Cynda Familia, CRNA Pre-anesthesia Checklist: Patient identified, Emergency Drugs available, Suction available and Patient being monitored Patient Re-evaluated:Patient Re-evaluated prior to induction Oxygen Delivery Method: Circle System Utilized Preoxygenation: Pre-oxygenation with 100% oxygen Induction Type: IV induction Ventilation: Mask ventilation without difficulty LMA: LMA inserted LMA Size: 4.0 Number of attempts: 1 Placement Confirmation: positive ETCO2 Tube secured with: Tape Dental Injury: Teeth and Oropharynx as per pre-operative assessment  Comments: Smooth IV induction Hatchette-- LMA insertion AM CRNA atraumatic-- teeth and mouth as preop bilat BS Hatchette

## 2019-11-28 NOTE — Discharge Instructions (Signed)
1. You may see some blood in the urine and may have some burning with urination for 48-72 hours. You also may notice that you have to urinate more frequently or urgently after your procedure which is normal.  °2. You should call should you develop an inability urinate, fever > 101, persistent nausea and vomiting that prevents you from eating or drinking to stay hydrated.  °

## 2019-11-28 NOTE — Anesthesia Procedure Notes (Signed)
Date/Time: 11/28/2019 5:32 PM Performed by: Cynda Familia, CRNA Oxygen Delivery Method: Simple face mask Placement Confirmation: positive ETCO2 and breath sounds checked- equal and bilateral Dental Injury: Teeth and Oropharynx as per pre-operative assessment

## 2019-11-28 NOTE — Interval H&P Note (Signed)
History and Physical Interval Note:  11/28/2019 3:51 PM  Felicia Guerrero  has presented today for surgery, with the diagnosis of LEFT DISTAL STONE.  The various methods of treatment have been discussed with the patient and family. After consideration of risks, benefits and other options for treatment, the patient has consented to  Procedure(s): CYSTOSCOPY/RETROGRADE/URETEROSCOPY/HOLMIUM LASER/STENT PLACEMENT (Left) as a surgical intervention.  The patient's history has been reviewed, patient examined, no change in status, stable for surgery.  I have reviewed the patient's chart and labs.  Questions were answered to the patient's satisfaction.     [employer]

## 2019-11-28 NOTE — Anesthesia Postprocedure Evaluation (Signed)
Anesthesia Post Note  Patient: Felicia Guerrero  Procedure(s) Performed: CYSTOSCOPY/RETROGRADE/URETEROSCOPY (Left )     Patient location during evaluation: PACU Anesthesia Type: General Level of consciousness: awake Pain management: pain level controlled Vital Signs Assessment: post-procedure vital signs reviewed and stable Respiratory status: spontaneous breathing Cardiovascular status: stable Postop Assessment: no apparent nausea or vomiting Anesthetic complications: no   No complications documented.  Last Vitals:  Vitals:   11/28/19 1800 11/28/19 1815  BP: 117/64 124/73  Pulse: 86 87  Resp: 14 15  Temp:    SpO2: 95% 97%    Last Pain:  Vitals:   11/28/19 1815  TempSrc:   PainSc: 0-No pain                 Huston Foley

## 2019-11-28 NOTE — Transfer of Care (Signed)
Immediate Anesthesia Transfer of Care Note  Patient: Felicia Guerrero  Procedure(s) Performed: CYSTOSCOPY/RETROGRADE/URETEROSCOPY (Left )  Patient Location: PACU  Anesthesia Type:General  Level of Consciousness: awake and alert   Airway & Oxygen Therapy: Patient Spontanous Breathing and Patient connected to face mask oxygen  Post-op Assessment: Report given to RN and Post -op Vital signs reviewed and stable  Post vital signs: Reviewed and stable  Last Vitals:  Vitals Value Taken Time  BP 119/66 11/28/19 1740  Temp 36.6 C 11/28/19 1740  Pulse 84 11/28/19 1744  Resp 13 11/28/19 1744  SpO2 100 % 11/28/19 1744  Vitals shown include unvalidated device data.  Last Pain:  Vitals:   11/28/19 1518  TempSrc:   PainSc: 0-No pain         Complications: No complications documented.

## 2019-11-29 ENCOUNTER — Ambulatory Visit (HOSPITAL_COMMUNITY)
Admission: RE | Admit: 2019-11-29 | Discharge: 2019-11-29 | Disposition: A | Discharge status: Home / Self Care | Payer: BC Managed Care – PPO | Source: Ambulatory Visit | Attending: Nephrology | Admitting: Nephrology

## 2019-11-29 ENCOUNTER — Encounter (HOSPITAL_COMMUNITY): Payer: Self-pay | Admitting: Urology

## 2019-11-29 VITALS — BP 157/77 | HR 67 | Temp 97.3°F | Resp 18

## 2019-11-29 DIAGNOSIS — N289 Disorder of kidney and ureter, unspecified: Secondary | ICD-10-CM | POA: Diagnosis present

## 2019-11-29 LAB — RENAL FUNCTION PANEL
Albumin: 3.7 g/dL (ref 3.5–5.0)
Anion gap: 16 — ABNORMAL HIGH (ref 5–15)
BUN: 86 mg/dL — ABNORMAL HIGH (ref 6–20)
CO2: 26 mmol/L (ref 22–32)
Calcium: 9.6 mg/dL (ref 8.9–10.3)
Chloride: 96 mmol/L — ABNORMAL LOW (ref 98–111)
Creatinine, Ser: 4.1 mg/dL — ABNORMAL HIGH (ref 0.44–1.00)
GFR calc non Af Amer: 12 mL/min — ABNORMAL LOW (ref >60)
Glucose, Bld: 201 mg/dL — ABNORMAL HIGH (ref 70–99)
Phosphorus: 5.2 mg/dL — ABNORMAL HIGH (ref 2.5–4.6)
Potassium: 4.5 mmol/L (ref 3.5–5.1)
Sodium: 138 mmol/L (ref 135–145)

## 2019-11-29 LAB — FERRITIN: Ferritin: 459 ng/mL — ABNORMAL HIGH (ref 11–307)

## 2019-11-29 LAB — POCT HEMOGLOBIN-HEMACUE: Hemoglobin: 10 g/dL — ABNORMAL LOW (ref 12.0–15.0)

## 2019-11-29 LAB — IRON AND TIBC
Iron: 93 ug/dL (ref 28–170)
Saturation Ratios: 38 % — ABNORMAL HIGH (ref 10.4–31.8)
TIBC: 244 ug/dL — ABNORMAL LOW (ref 250–450)
UIBC: 151 ug/dL

## 2019-11-29 MED ORDER — EPOETIN ALFA-EPBX 10000 UNIT/ML IJ SOLN: 20000.0000 [IU] | INTRAMUSCULAR | Status: DC

## 2019-11-29 MED ORDER — EPOETIN ALFA-EPBX 10000 UNIT/ML IJ SOLN
INTRAMUSCULAR | Status: AC
  Administered 2019-11-29: 20000 [IU] via SUBCUTANEOUS
  Filled 2019-11-29: qty 2

## 2019-12-27 ENCOUNTER — Other Ambulatory Visit: Payer: Self-pay

## 2019-12-27 ENCOUNTER — Encounter (HOSPITAL_COMMUNITY)
Admission: RE | Admit: 2019-12-27 | Discharge: 2019-12-27 | Disposition: A | Discharge status: Home / Self Care | Payer: BC Managed Care – PPO | Source: Ambulatory Visit | Attending: Nephrology | Admitting: Nephrology

## 2019-12-27 VITALS — BP 118/61 | HR 81 | Temp 98.3°F | Resp 18

## 2019-12-27 DIAGNOSIS — N289 Disorder of kidney and ureter, unspecified: Secondary | ICD-10-CM | POA: Diagnosis present

## 2019-12-27 LAB — IRON AND TIBC
Iron: 80 ug/dL (ref 28–170)
Saturation Ratios: 30 % (ref 10.4–31.8)
TIBC: 266 ug/dL (ref 250–450)
UIBC: 186 ug/dL

## 2019-12-27 LAB — RENAL FUNCTION PANEL
Albumin: 3.7 g/dL (ref 3.5–5.0)
Anion gap: 17 — ABNORMAL HIGH (ref 5–15)
BUN: 81 mg/dL — ABNORMAL HIGH (ref 6–20)
CO2: 23 mmol/L (ref 22–32)
Calcium: 9.2 mg/dL (ref 8.9–10.3)
Chloride: 93 mmol/L — ABNORMAL LOW (ref 98–111)
Creatinine, Ser: 4.53 mg/dL — ABNORMAL HIGH (ref 0.44–1.00)
GFR, Estimated: 11 mL/min — ABNORMAL LOW (ref >60)
Glucose, Bld: 427 mg/dL — ABNORMAL HIGH (ref 70–99)
Phosphorus: 4.3 mg/dL (ref 2.5–4.6)
Potassium: 4.9 mmol/L (ref 3.5–5.1)
Sodium: 133 mmol/L — ABNORMAL LOW (ref 135–145)

## 2019-12-27 LAB — POCT HEMOGLOBIN-HEMACUE: Hemoglobin: 9.5 g/dL — ABNORMAL LOW (ref 12.0–15.0)

## 2019-12-27 LAB — FERRITIN: Ferritin: 343 ng/mL — ABNORMAL HIGH (ref 11–307)

## 2019-12-27 MED ORDER — EPOETIN ALFA-EPBX 10000 UNIT/ML IJ SOLN
INTRAMUSCULAR | Status: AC
  Filled 2019-12-27: qty 2

## 2019-12-27 MED ORDER — EPOETIN ALFA-EPBX 10000 UNIT/ML IJ SOLN
20000.0000 [IU] | INTRAMUSCULAR | Status: DC
  Administered 2019-12-27: 20000 [IU] via SUBCUTANEOUS

## 2020-01-24 ENCOUNTER — Encounter (HOSPITAL_COMMUNITY)
Admission: RE | Admit: 2020-01-24 | Discharge: 2020-01-24 | Disposition: A | Discharge status: Home / Self Care | Payer: BC Managed Care – PPO | Source: Ambulatory Visit | Attending: Nephrology | Admitting: Nephrology

## 2020-01-24 ENCOUNTER — Other Ambulatory Visit: Payer: Self-pay

## 2020-01-24 VITALS — BP 133/75 | HR 88 | Temp 98.8°F | Resp 18

## 2020-01-24 DIAGNOSIS — N289 Disorder of kidney and ureter, unspecified: Secondary | ICD-10-CM

## 2020-01-24 LAB — POCT HEMOGLOBIN-HEMACUE: Hemoglobin: 10.3 g/dL — ABNORMAL LOW (ref 12.0–15.0)

## 2020-01-24 MED ORDER — EPOETIN ALFA-EPBX 10000 UNIT/ML IJ SOLN
INTRAMUSCULAR | Status: AC
  Filled 2020-01-24: qty 2

## 2020-01-24 MED ORDER — EPOETIN ALFA-EPBX 10000 UNIT/ML IJ SOLN
20000.0000 [IU] | INTRAMUSCULAR | Status: DC
  Administered 2020-01-24: 20000 [IU] via SUBCUTANEOUS

## 2020-02-21 ENCOUNTER — Other Ambulatory Visit: Payer: Self-pay

## 2020-02-21 ENCOUNTER — Encounter (HOSPITAL_COMMUNITY)
Admission: RE | Admit: 2020-02-21 | Discharge: 2020-02-21 | Disposition: A | Discharge status: Home / Self Care | Payer: BC Managed Care – PPO | Source: Ambulatory Visit | Attending: Nephrology | Admitting: Nephrology

## 2020-02-21 VITALS — BP 144/78 | HR 84 | Temp 98.2°F | Resp 18

## 2020-02-21 DIAGNOSIS — N289 Disorder of kidney and ureter, unspecified: Secondary | ICD-10-CM | POA: Diagnosis not present

## 2020-02-21 LAB — RENAL FUNCTION PANEL
Albumin: 3.8 g/dL (ref 3.5–5.0)
Anion gap: 14 (ref 5–15)
BUN: 64 mg/dL — ABNORMAL HIGH (ref 6–20)
CO2: 27 mmol/L (ref 22–32)
Calcium: 9.7 mg/dL (ref 8.9–10.3)
Chloride: 94 mmol/L — ABNORMAL LOW (ref 98–111)
Creatinine, Ser: 3.75 mg/dL — ABNORMAL HIGH (ref 0.44–1.00)
GFR, Estimated: 14 mL/min — ABNORMAL LOW (ref >60)
Glucose, Bld: 208 mg/dL — ABNORMAL HIGH (ref 70–99)
Phosphorus: 4.6 mg/dL (ref 2.5–4.6)
Potassium: 4.3 mmol/L (ref 3.5–5.1)
Sodium: 135 mmol/L (ref 135–145)

## 2020-02-21 LAB — FERRITIN: Ferritin: 453 ng/mL — ABNORMAL HIGH (ref 11–307)

## 2020-02-21 LAB — IRON AND TIBC
Iron: 81 ug/dL (ref 28–170)
Saturation Ratios: 28 % (ref 10.4–31.8)
TIBC: 288 ug/dL (ref 250–450)
UIBC: 207 ug/dL

## 2020-02-21 LAB — POCT HEMOGLOBIN-HEMACUE: Hemoglobin: 10 g/dL — ABNORMAL LOW (ref 12.0–15.0)

## 2020-02-21 MED ORDER — EPOETIN ALFA-EPBX 10000 UNIT/ML IJ SOLN
20000.0000 [IU] | INTRAMUSCULAR | Status: DC
  Administered 2020-02-21: 20000 [IU] via SUBCUTANEOUS

## 2020-02-21 MED ORDER — EPOETIN ALFA-EPBX 10000 UNIT/ML IJ SOLN
INTRAMUSCULAR | Status: AC
  Filled 2020-02-21: qty 2

## 2020-03-15 ENCOUNTER — Other Ambulatory Visit: Payer: Self-pay | Admitting: Family Medicine

## 2020-03-15 DIAGNOSIS — Z01818 Encounter for other preprocedural examination: Secondary | ICD-10-CM

## 2020-03-20 ENCOUNTER — Other Ambulatory Visit: Payer: Self-pay

## 2020-03-20 ENCOUNTER — Ambulatory Visit (HOSPITAL_COMMUNITY)
Admission: RE | Admit: 2020-03-20 | Discharge: 2020-03-20 | Disposition: A | Discharge status: Home / Self Care | Payer: BC Managed Care – PPO | Source: Ambulatory Visit | Attending: Nephrology | Admitting: Nephrology

## 2020-03-20 DIAGNOSIS — N289 Disorder of kidney and ureter, unspecified: Secondary | ICD-10-CM | POA: Insufficient documentation

## 2020-03-20 LAB — IRON AND TIBC
Iron: 69 ug/dL (ref 28–170)
Saturation Ratios: 24 % (ref 10.4–31.8)
TIBC: 291 ug/dL (ref 250–450)
UIBC: 222 ug/dL

## 2020-03-20 LAB — RENAL FUNCTION PANEL
Albumin: 4 g/dL (ref 3.5–5.0)
Anion gap: 12 (ref 5–15)
BUN: 93 mg/dL — ABNORMAL HIGH (ref 6–20)
CO2: 26 mmol/L (ref 22–32)
Calcium: 9.6 mg/dL (ref 8.9–10.3)
Chloride: 99 mmol/L (ref 98–111)
Creatinine, Ser: 3.64 mg/dL — ABNORMAL HIGH (ref 0.44–1.00)
GFR, Estimated: 15 mL/min — ABNORMAL LOW (ref >60)
Glucose, Bld: 188 mg/dL — ABNORMAL HIGH (ref 70–99)
Phosphorus: 4.8 mg/dL — ABNORMAL HIGH (ref 2.5–4.6)
Potassium: 4.5 mmol/L (ref 3.5–5.1)
Sodium: 137 mmol/L (ref 135–145)

## 2020-03-20 LAB — FERRITIN: Ferritin: 485 ng/mL — ABNORMAL HIGH (ref 11–307)

## 2020-03-20 LAB — POCT HEMOGLOBIN-HEMACUE: Hemoglobin: 11.1 g/dL — ABNORMAL LOW (ref 12.0–15.0)

## 2020-03-20 MED ORDER — EPOETIN ALFA-EPBX 10000 UNIT/ML IJ SOLN
INTRAMUSCULAR | Status: AC
  Filled 2020-03-20: qty 2

## 2020-03-20 MED ORDER — EPOETIN ALFA-EPBX 10000 UNIT/ML IJ SOLN
20000.0000 [IU] | INTRAMUSCULAR | Status: DC
  Administered 2020-03-20: 20000 [IU] via SUBCUTANEOUS

## 2020-03-22 ENCOUNTER — Other Ambulatory Visit: Payer: Self-pay

## 2020-03-22 ENCOUNTER — Ambulatory Visit: Payer: BC Managed Care – PPO | Admitting: Cardiology

## 2020-03-22 ENCOUNTER — Encounter: Payer: Self-pay | Admitting: Cardiology

## 2020-03-22 VITALS — BP 115/58 | HR 92 | Temp 98.0°F | Resp 16 | Ht 66.0 in | Wt 143.0 lb

## 2020-03-22 DIAGNOSIS — Z7682 Awaiting organ transplant status: Secondary | ICD-10-CM

## 2020-03-22 DIAGNOSIS — E7800 Pure hypercholesterolemia, unspecified: Secondary | ICD-10-CM

## 2020-03-22 DIAGNOSIS — E118 Type 2 diabetes mellitus with unspecified complications: Secondary | ICD-10-CM

## 2020-03-22 DIAGNOSIS — R0989 Other specified symptoms and signs involving the circulatory and respiratory systems: Secondary | ICD-10-CM

## 2020-03-22 DIAGNOSIS — Z01810 Encounter for preprocedural cardiovascular examination: Secondary | ICD-10-CM

## 2020-03-22 DIAGNOSIS — I10 Essential (primary) hypertension: Secondary | ICD-10-CM

## 2020-03-22 DIAGNOSIS — Z794 Long term (current) use of insulin: Secondary | ICD-10-CM

## 2020-03-22 NOTE — Progress Notes (Signed)
Primary Physician/Referring:  Bartholome Bill, MD  Patient ID: Felicia Guerrero, female    DOB: 1970-03-24, 50 y.o.   MRN: [id number]  Chief Complaint  Patient presents with  . Kidney Transplant  . New Patient (Initial Visit)    Referred by Precious Haws, MD   HPI:    Felicia Guerrero  is a 50 y.o. African-American female with extensive medical history, pertinent to cardiac include hypertension, hyperlipidemia, diabetes mellitus with diabetic retinopathy and diabetic chronic kidney disease, history of stroke in 2016 with left-sided weakness has mostly resolved, pericardial effusion and history of pericardial window 06/21/2018 referred to me for pretransplant cardiac work-up. She is asymptomatic.   Past Medical History:  Diagnosis Date  . Anemia    hx of  . Arthritis    right pinky osteoarthritis  . Chronic kidney disease    stage 3 ckd  . Depression    hx of  . Diabetes mellitus without complication ([place])    type 2  . Heart murmur   . History of kidney stones   . Hypertension   . Pneumothorax   . Stroke [facility])    short term memory loss and left side weakness /blindness right eye   Nov 28 2014   Past Surgical History:  Procedure Laterality Date  . CARDIAC SURGERY     For pericardial effusion  . CESAREAN SECTION    . CHEST TUBE INSERTION    . CYSTOSCOPY W/ URETERAL STENT PLACEMENT Left 02/15/2017   Procedure: CYSTOSCOPY WITH LEFT URETERAL STENT PLACEMENT;  Surgeon: Raynelle Bring, MD;  [facility];  Service: Urology;  Laterality: Left;  . CYSTOSCOPY/URETEROSCOPY/HOLMIUM LASER/STENT PLACEMENT Left 03/12/2017   Procedure: CYSTOSCOPY/URETEROSCOPY/HOLMIUM LASER/STENT PLACEMENT;  Surgeon: Raynelle Bring, MD;  Location[facility];  Service: Urology;  Laterality: Left;  . CYSTOSCOPY/URETEROSCOPY/HOLMIUM LASER/STENT PLACEMENT Left 11/28/2019   Procedure: CYSTOSCOPY/RETROGRADE/URETEROSCOPY;  Surgeon: Raynelle Bring, MD;  Location: [facility];  Service: Urology;  Laterality:  Left;  . EYE SURGERY     right eye  . HYSTERECTOMY ABDOMINAL WITH SALPINGECTOMY    . KIDNEY STONE SURGERY    . PERICARDIAL WINDOW    . WISDOM TOOTH EXTRACTION     Family History  Problem Relation Age of Onset  . Diabetes Mother   . Congestive Heart Failure Mother   . Prostate cancer Father   . Diabetes Sister   . High blood pressure Brother     Social History   Tobacco Use  . Smoking status: Never Smoker  . Smokeless tobacco: Never Used  Substance Use Topics  . Alcohol use: No   Marital Status: Married  ROS  Review of Systems  Cardiovascular: Negative for chest pain, dyspnea on exertion and leg swelling.  Gastrointestinal: Negative for melena.   Objective  Blood pressure (!) 115/58, pulse 92, temperature 98 F (36.7 C), resp. rate 16, height 5' 6"  (1.676 m), weight 143 lb (64.9 kg), SpO2 100 %.  Vitals with BMI 03/22/2020 02/21/2020 01/24/2020  Height 5' 6"  - -  Weight 143 lbs - -  BMI 56.81 - -  Systolic 275 170 017  Diastolic 58 78 75  Pulse 92 84 88     Physical Exam Constitutional:      Appearance: She is normal weight.  Cardiovascular:     Rate and Rhythm: Normal rate and regular rhythm.     Pulses: Normal pulses and intact distal pulses.          Carotid pulses are on the right side  with bruit.    Heart sounds: Normal heart sounds. No murmur heard. No gallop.      Comments: No leg edema, no JVD. Prominent abdominal bruit Pulmonary:     Effort: Pulmonary effort is normal.     Breath sounds: Normal breath sounds.  Abdominal:     General: Bowel sounds are normal.     Palpations: Abdomen is soft.  Musculoskeletal:     Cervical back: Normal range of motion.    Laboratory examination:   Recent Labs    10/04/19 0808 11/01/19 0800 11/24/19 1424 11/29/19 0812 12/27/19 0820 02/21/20 0805 03/20/20 0820  NA 137 136 136   < > 133* 135 137  K 3.5 4.5 4.1   < > 4.9 4.3 4.5  CL 98 99 95*   < > 93* 94* 99  CO2 24 24 27    < > 23 27 26   GLUCOSE 250* 195*  264*   < > 427* 208* 188*  BUN 112* 93* 94*   < > 81* 64* 93*  CREATININE 4.85* 5.07* 4.14*   < > 4.53* 3.75* 3.64*  CALCIUM 10.0 9.4 9.9   < > 9.2 9.7 9.6  GFRNONAA 10* 9* 12*   < > 11* 14* 15*  GFRAA 11* 11* 14*  --   --   --   --    < > = values in this interval not displayed.   estimated creatinine clearance is 17.3 mL/min (A) (by C-G formula based on SCr of 3.64 mg/dL (H)).  CMP Latest Ref Rng & Units 03/20/2020 02/21/2020 12/27/2019  Glucose 70 - 99 mg/dL 188(H) 208(H) 427(H)  BUN 6 - 20 mg/dL 93(H) 64(H) 81(H)  Creatinine 0.44 - 1.00 mg/dL 3.64(H) 3.75(H) 4.53(H)  Sodium 135 - 145 mmol/L 137 135 133(L)  Potassium 3.5 - 5.1 mmol/L 4.5 4.3 4.9  Chloride 98 - 111 mmol/L 99 94(L) 93(L)  CO2 22 - 32 mmol/L 26 27 23   Calcium 8.9 - 10.3 mg/dL 9.6 9.7 9.2  Total Protein 6.5 - 8.1 g/dL - - -  Total Bilirubin 0.3 - 1.2 mg/dL - - -  Alkaline Phos 38 - 126 U/L - - -  AST 15 - 41 U/L - - -  ALT 14 - 54 U/L - - -   CBC Latest Ref Rng & Units 03/20/2020 02/21/2020 01/24/2020  WBC 4.0 - 10.5 K/uL - - -  Hemoglobin 12.0 - 15.0 g/dL 11.1(L) 10.0(L) 10.3(L)  Hematocrit 36.0 - 46.0 % - - -  Platelets 150 - 400 K/uL - - -    Lipid Panel No results for input(s): CHOL, TRIG, LDLCALC, VLDL, HDL, CHOLHDL, LDLDIRECT in the last 8760 hours.  HEMOGLOBIN A1C Lab Results  Component Value Date   HGBA1C 8.7 (H) 11/24/2019   MPG 202.99 11/24/2019   TSH No results for input(s): TSH in the last 8760 hours.  External labs:   Labs 03/09/2020:  Serum creatinine 4.6, EGFR 10 mL. O+. A1c 9.7%. CHOL 104 12/08/2018  TRIG 85 12/08/2018  HDL 44 12/08/2018  LDL 43 12/08/2018    Medications and allergies   Allergies  Allergen Reactions  . Cephalexin Diarrhea     Outpatient Medications Prior to Visit  Medication Sig Dispense Refill  . aspirin 81 MG EC tablet Take 81 mg by mouth at bedtime.     . chlorthalidone (HYGROTON) 25 MG tablet Take 25 mg by mouth daily.    . Insulin Degludec (TRESIBA)  100 UNIT/ML SOLN Inject 7 Units into the  skin at bedtime.    Marland Kitchen lisinopril (ZESTRIL) 20 MG tablet Take 20 mg by mouth daily.     . Magnesium Oxide 420 MG TABS Take 420 mg by mouth daily.    . metoprolol succinate (TOPROL-XL) 50 MG 24 hr tablet Take 50 mg by mouth at bedtime.    . Multiple Vitamin (MULTIVITAMIN WITH MINERALS) TABS tablet Take 1 tablet by mouth daily.    Marland Kitchen NIFEdipine (ADALAT CC) 90 MG 24 hr tablet Take 90 mg by mouth daily.    . rosuvastatin (CRESTOR) 20 MG tablet Take 20 mg by mouth every evening.    . SitaGLIPtin-MetFORMIN HCl 50-1000 MG TB24 Take 1 tablet by mouth in the morning and at bedtime.     Marland Kitchen doxycycline (VIBRAMYCIN) 100 MG capsule Take 100 mg by mouth 2 (two) times daily.    Marland Kitchen HYDROcodone-acetaminophen (NORCO/VICODIN) 5-325 MG tablet Take 1 tablet by mouth every 6 (six) hours as needed for pain.    Marland Kitchen ondansetron (ZOFRAN) 4 MG tablet Take 4 mg by mouth every 6 (six) hours as needed for nausea/vomiting.    . tamsulosin (FLOMAX) 0.4 MG CAPS capsule Take 0.4 mg by mouth at bedtime.     No facility-administered medications prior to visit.    Radiology:   XR CHEST PA AND LATERAL 1. Normal heart size. Questionable enlargement of the main pulmonary artery. 2. Clear lungs. 3. No pleural abnormality.   Cardiac Studies:   None available on extensive search  EKG:     EKG 03/22/2020: Normal sinus rhythm at rate of 91 bpm, left atrial enlargement, normal axis.  Nonspecific T abnormality.      Assessment     ICD-10-CM   1. Pre-operative cardiovascular examination, high risk surgery  Z01.810 PCV MYOCARDIAL PERFUSION WO LEXISCAN  2. Awaiting organ transplant status  Z76.82   3. Kidney transplant candidate  Z76.82   4. Controlled type 2 diabetes mellitus with complication, with long-term current use of insulin (HCC)  E11.8    Z79.4   5. Primary hypertension  I10 EKG 12-Lead    PCV ECHOCARDIOGRAM COMPLETE    PCV RENAL/RENAL ARTERY DUPLEX COMPLETE    TSH  6.  Hypercholesteremia  E78.00 Lipid Panel With LDL/HDL Ratio  7. Abdominal bruit  R09.89 PCV RENAL/RENAL ARTERY DUPLEX COMPLETE  8. Bruit of right carotid artery  R09.89 PCV CAROTID DUPLEX (BILATERAL)     Medications Discontinued During This Encounter  Medication Reason  . tamsulosin (FLOMAX) 0.4 MG CAPS capsule Entry Error  . ondansetron (ZOFRAN) 4 MG tablet No longer needed (for PRN medications)  . HYDROcodone-acetaminophen (NORCO/VICODIN) 5-325 MG tablet No longer needed (for PRN medications)  . doxycycline (VIBRAMYCIN) 100 MG capsule Completed Course  . tamsulosin (FLOMAX) 0.4 MG CAPS capsule Entry Error    No orders of the defined types were placed in this encounter.  Orders Placed This Encounter  Procedures  . Lipid Panel With LDL/HDL Ratio  . TSH  . PCV MYOCARDIAL PERFUSION WO LEXISCAN    Standing Status:   Future    Standing Expiration Date:   05/20/2020  . EKG 12-Lead  . PCV ECHOCARDIOGRAM COMPLETE    Standing Status:   Future    Standing Expiration Date:   03/22/2021    Recommendations:   Felicia Guerrero is a 50 y.o. African-American female with extensive medical history, pertinent to cardiac include hypertension, hyperlipidemia, diabetes mellitus with diabetic retinopathy and diabetic chronic kidney disease, history of stroke in 2016 with left-sided weakness has mostly  resolved, pericardial effusion and history of pericardial window 06/21/2018 referred to me for pretransplant cardiac work-up.  Patient has remained stable with chronic stage IV kidney disease and has been screened at [facility] for kidney transplant.  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  Schedule for a Exercise Nuclear stress test to evaluate for myocardial ischemia. Will schedule for an echocardiogram. Schedule for carotid duplex for bruit/follow-up surveillance of carotid stenosis.   I did extensive search on care  everywhere and I am unable to find all the labs that are needed.  I will obtain  Lipids & TSH.  Blood pressure is well controlled available data suggests excellent control of her lipids and vascular examination reveals a very prominent abdominal bruit hence will obtain renal artery duplex as well.  Patient has had CT of the abdomen in 2018 revealing normal-sized kidneys performed as a part of work-up for kidney stones.  If indeed her kidney size is normal, evaluation for either atherosclerotic or FMD renal artery stenosis may change the game plan.  I will see the patient after the above tests.   Adrian Prows, MD, [facility] 03/24/2020, 8:28 AM Office: [phone]  CC: Dr. Cornell Barman, Chaney Malling, MD (Transplant Team)

## 2020-03-26 ENCOUNTER — Other Ambulatory Visit: Payer: Self-pay

## 2020-03-26 ENCOUNTER — Ambulatory Visit: Payer: BC Managed Care – PPO

## 2020-03-26 DIAGNOSIS — Z01810 Encounter for preprocedural cardiovascular examination: Secondary | ICD-10-CM

## 2020-03-30 ENCOUNTER — Other Ambulatory Visit: Payer: BC Managed Care – PPO

## 2020-04-03 LAB — TSH: TSH: 1.4 u[IU]/mL (ref 0.450–4.500)

## 2020-04-03 LAB — LIPID PANEL WITH LDL/HDL RATIO
Cholesterol, Total: 164 mg/dL (ref 100–199)
HDL: 58 mg/dL (ref >39)
LDL Chol Calc (NIH): 89 mg/dL (ref 0–99)
LDL/HDL Ratio: 1.5 ratio (ref 0.0–3.2)
Triglycerides: 92 mg/dL (ref 0–149)
VLDL Cholesterol Cal: 17 mg/dL (ref 5–40)

## 2020-04-06 ENCOUNTER — Other Ambulatory Visit: Payer: BC Managed Care – PPO

## 2020-04-11 ENCOUNTER — Ambulatory Visit: Payer: BC Managed Care – PPO

## 2020-04-11 ENCOUNTER — Other Ambulatory Visit: Payer: Self-pay

## 2020-04-11 DIAGNOSIS — R0989 Other specified symptoms and signs involving the circulatory and respiratory systems: Secondary | ICD-10-CM

## 2020-04-11 DIAGNOSIS — I10 Essential (primary) hypertension: Secondary | ICD-10-CM

## 2020-04-12 NOTE — Telephone Encounter (Signed)
From patient.

## 2020-04-14 ENCOUNTER — Emergency Department (HOSPITAL_COMMUNITY)
Admission: EM | Admit: 2020-04-14 | Discharge: 2020-04-14 | Disposition: A | Discharge status: Home / Self Care | Payer: BC Managed Care – PPO | Attending: Emergency Medicine | Admitting: Emergency Medicine

## 2020-04-14 ENCOUNTER — Encounter (HOSPITAL_COMMUNITY): Payer: Self-pay | Admitting: Emergency Medicine

## 2020-04-14 ENCOUNTER — Other Ambulatory Visit: Payer: Self-pay

## 2020-04-14 ENCOUNTER — Emergency Department (HOSPITAL_COMMUNITY): Payer: BC Managed Care – PPO

## 2020-04-14 DIAGNOSIS — N183 Chronic kidney disease, stage 3 unspecified: Secondary | ICD-10-CM | POA: Diagnosis not present

## 2020-04-14 DIAGNOSIS — Z8673 Personal history of transient ischemic attack (TIA), and cerebral infarction without residual deficits: Secondary | ICD-10-CM | POA: Insufficient documentation

## 2020-04-14 DIAGNOSIS — E1165 Type 2 diabetes mellitus with hyperglycemia: Secondary | ICD-10-CM | POA: Insufficient documentation

## 2020-04-14 DIAGNOSIS — R739 Hyperglycemia, unspecified: Secondary | ICD-10-CM

## 2020-04-14 DIAGNOSIS — Z7982 Long term (current) use of aspirin: Secondary | ICD-10-CM | POA: Diagnosis not present

## 2020-04-14 DIAGNOSIS — I129 Hypertensive chronic kidney disease with stage 1 through stage 4 chronic kidney disease, or unspecified chronic kidney disease: Secondary | ICD-10-CM | POA: Insufficient documentation

## 2020-04-14 DIAGNOSIS — Z79899 Other long term (current) drug therapy: Secondary | ICD-10-CM | POA: Insufficient documentation

## 2020-04-14 DIAGNOSIS — E1122 Type 2 diabetes mellitus with diabetic chronic kidney disease: Secondary | ICD-10-CM | POA: Insufficient documentation

## 2020-04-14 DIAGNOSIS — Z794 Long term (current) use of insulin: Secondary | ICD-10-CM | POA: Insufficient documentation

## 2020-04-14 DIAGNOSIS — M79674 Pain in right toe(s): Secondary | ICD-10-CM | POA: Diagnosis not present

## 2020-04-14 LAB — BASIC METABOLIC PANEL
Anion gap: 14 (ref 5–15)
BUN: 94 mg/dL — ABNORMAL HIGH (ref 6–20)
CO2: 27 mmol/L (ref 22–32)
Calcium: 8.7 mg/dL — ABNORMAL LOW (ref 8.9–10.3)
Chloride: 80 mmol/L — ABNORMAL LOW (ref 98–111)
Creatinine, Ser: 4.72 mg/dL — ABNORMAL HIGH (ref 0.44–1.00)
GFR, Estimated: 11 mL/min — ABNORMAL LOW (ref >60)
Glucose, Bld: 705 mg/dL (ref 70–99)
Potassium: 4.7 mmol/L (ref 3.5–5.1)
Sodium: 121 mmol/L — ABNORMAL LOW (ref 135–145)

## 2020-04-14 LAB — I-STAT BETA HCG BLOOD, ED (MC, WL, AP ONLY): I-stat hCG, quantitative: 5.4 m[IU]/mL — ABNORMAL HIGH (ref <5)

## 2020-04-14 LAB — URINALYSIS, ROUTINE W REFLEX MICROSCOPIC
Bacteria, UA: NONE SEEN
Bilirubin Urine: NEGATIVE
Glucose, UA: >=500 mg/dL — AB
Ketones, ur: NEGATIVE mg/dL
Leukocytes,Ua: NEGATIVE
Nitrite: NEGATIVE
Protein, ur: 100 mg/dL — AB
Specific Gravity, Urine: 1.012 (ref 1.005–1.030)
pH: 5 (ref 5.0–8.0)

## 2020-04-14 LAB — CBG MONITORING, ED
Glucose-Capillary: >600 mg/dL (ref 70–99)
Glucose-Capillary: 568 mg/dL (ref 70–99)
Glucose-Capillary: 524 mg/dL (ref 70–99)
Glucose-Capillary: 480 mg/dL — ABNORMAL HIGH (ref 70–99)

## 2020-04-14 LAB — CBC
HCT: 33.3 % — ABNORMAL LOW (ref 36.0–46.0)
Hemoglobin: 11 g/dL — ABNORMAL LOW (ref 12.0–15.0)
MCH: 29.3 pg (ref 26.0–34.0)
MCHC: 33 g/dL (ref 30.0–36.0)
MCV: 88.8 fL (ref 80.0–100.0)
Platelets: 332 10*3/uL (ref 150–400)
RBC: 3.75 MIL/uL — ABNORMAL LOW (ref 3.87–5.11)
RDW: 12.9 % (ref 11.5–15.5)
WBC: 7.5 10*3/uL (ref 4.0–10.5)
nRBC: 0 % (ref 0.0–0.2)

## 2020-04-14 MED ORDER — INSULIN ASPART 100 UNIT/ML ~~LOC~~ SOLN
10.0000 [IU] | Freq: Once | SUBCUTANEOUS | Status: AC
  Administered 2020-04-14: 10 [IU] via SUBCUTANEOUS
  Filled 2020-04-14: qty 0.1

## 2020-04-14 MED ORDER — DEXTROSE IN LACTATED RINGERS 5 % IV SOLN: INTRAVENOUS | Status: DC

## 2020-04-14 MED ORDER — INSULIN ASPART PROT & ASPART (70-30 MIX) 100 UNIT/ML ~~LOC~~ SUSP: 4.0000 [IU] | Freq: Once | SUBCUTANEOUS | Status: DC

## 2020-04-14 MED ORDER — DEXTROSE 50 % IV SOLN: 0.0000 mL | INTRAVENOUS | Status: DC | PRN

## 2020-04-14 MED ORDER — INSULIN ASPART 100 UNIT/ML ~~LOC~~ SOLN
4.0000 [IU] | Freq: Once | SUBCUTANEOUS | Status: DC
  Filled 2020-04-14: qty 0.04

## 2020-04-14 MED ORDER — DOXYCYCLINE HYCLATE 100 MG PO CAPS: 100.0000 mg | ORAL_CAPSULE | Freq: Two times a day (BID) | ORAL | 0 refills | Status: DC

## 2020-04-14 MED ORDER — LACTATED RINGERS IV SOLN: INTRAVENOUS | Status: DC

## 2020-04-14 NOTE — ED Provider Notes (Signed)
Felicia Guerrero Provider Note   CSN: [id number] Arrival date & time: 04/14/20  1201     History Chief Complaint  Patient presents with  . Hyperglycemia    Felicia Guerrero is a 50 y.o. female.  HPI 50 year old female with a history of anemia, arthritis, CKD stage III, depression, DM type II, kidney stones, hypertension presents to the ER with complaints of high blood sugars.  Patient states that she recently had her insulin regimen switched up by her endocrinologist, but had to wait a couple days to get her new insulin.  Due to this gap in treatment, she was not able to establish a baseline blood sugar to restart her insulin as her glucometer read "high".  She also states that she has a history of strokes and was concerned that she may have a stroke again since her blood sugars were so high.  She denies any new neurologic deficits however.  She also complains of some new onset pain to her right toe.  She denies any injuries, but has noticed a little bit of drainage from the toenail.  He has been putting topical antibiotics on it.  She overall is in a good state of health, has no complaints at this time, no nausea, vomiting, abdominal pain, chest pain, shortness of breath.     Past Medical History:  Diagnosis Date  . Anemia    hx of  . Arthritis    right pinky osteoarthritis  . Chronic kidney disease    stage 3 ckd  . Depression    hx of  . Diabetes mellitus without complication ([place])    type 2  . Heart murmur   . History of kidney stones   . Hypertension   . Pneumothorax   . Stroke [facility])    short term memory loss and left side weakness /blindness right eye   Nov 28 2014    Patient Active Problem List   Diagnosis Date Noted  . Pyelonephritis 02/17/2017  . Sepsis ([place]) 02/17/2017  . Acute renal insufficiency 02/17/2017  . Left ureteral stone 02/15/2017    Past Surgical History:  Procedure Laterality Date  . CARDIAC SURGERY     For  pericardial effusion  . CESAREAN SECTION    . CHEST TUBE INSERTION    . CYSTOSCOPY W/ URETERAL STENT PLACEMENT Left 02/15/2017   Procedure: CYSTOSCOPY WITH LEFT URETERAL STENT PLACEMENT;  Surgeon: Raynelle Bring, MD;  Location: [facility];  Service: Urology;  Laterality: Left;  . CYSTOSCOPY/URETEROSCOPY/HOLMIUM LASER/STENT PLACEMENT Left 03/12/2017   Procedure: CYSTOSCOPY/URETEROSCOPY/HOLMIUM LASER/STENT PLACEMENT;  Surgeon: Raynelle Bring, MD;  Location: [facility];  Service: Urology;  Laterality: Left;  . CYSTOSCOPY/URETEROSCOPY/HOLMIUM LASER/STENT PLACEMENT Left 11/28/2019   Procedure: CYSTOSCOPY/RETROGRADE/URETEROSCOPY;  Surgeon: Raynelle Bring, MD;  Location: [facility];  Service: Urology;  Laterality: Left;  . EYE SURGERY     right eye  . HYSTERECTOMY ABDOMINAL WITH SALPINGECTOMY    . KIDNEY STONE SURGERY    . PERICARDIAL WINDOW    . WISDOM TOOTH EXTRACTION       OB History   No obstetric history on file.     Family History  Problem Relation Age of Onset  . Diabetes Mother   . Congestive Heart Failure Mother   . Prostate cancer Father   . Diabetes Sister   . High blood pressure Brother     Social History   Tobacco Use  . Smoking status: Never Smoker  . Smokeless tobacco: Never Used  Vaping Use  .  Vaping Use: Never used  Substance Use Topics  . Alcohol use: No  . Drug use: No    Home Medications Prior to Admission medications   Medication Sig Start Date End Date Taking? Authorizing Provider  acetaminophen (TYLENOL) 500 MG tablet Take 500 mg by mouth every 6 (six) hours as needed for moderate pain.   Yes [provider]  aspirin 81 MG EC tablet Take 81 mg by mouth at bedtime.    Yes [provider]  chlorthalidone (HYGROTON) 25 MG tablet Take 25 mg by mouth daily.   Yes [provider]  ciclopirox (PENLAC) 8 % solution Apply 1 application topically 2 (two) times daily. 04/10/20  Yes [provider]  clotrimazole-betamethasone (LOTRISONE)  cream Apply 1 application topically 2 (two) times daily. 04/10/20  Yes [provider]  doxycycline (VIBRAMYCIN) 100 MG capsule Take 1 capsule (100 mg total) by mouth 2 (two) times daily for 7 days. 04/14/20 04/21/20 Yes Garald Balding, PA-C  Insulin Aspart, w/Niacinamide, (FIASP PENFILL) 100 UNIT/ML SOCT Inject 4 Units into the skin See admin instructions. Before each meal 04/03/20  Yes [provider]  Insulin Degludec (TRESIBA) 100 UNIT/ML SOLN Inject 7 Units into the skin at bedtime.   Yes [provider]  lisinopril (ZESTRIL) 20 MG tablet Take 20 mg by mouth daily.    Yes [provider]  Magnesium Oxide 420 MG TABS Take 420 mg by mouth daily.   Yes [provider]  metoprolol succinate (TOPROL-XL) 50 MG 24 hr tablet Take 50 mg by mouth at bedtime. 09/09/19  Yes [provider]  Multiple Vitamin (MULTIVITAMIN WITH MINERALS) TABS tablet Take 1 tablet by mouth daily.   Yes [provider]  NIFEdipine (ADALAT CC) 90 MG 24 hr tablet Take 90 mg by mouth daily. 11/19/19  Yes [provider]  rosuvastatin (CRESTOR) 20 MG tablet Take 20 mg by mouth every evening. 05/27/16  Yes [provider]    Allergies    Cephalexin  Review of Systems   Review of Systems  Constitutional: Negative for chills and fever.  HENT: Negative for ear pain and sore throat.   Eyes: Negative for pain and visual disturbance.  Respiratory: Negative for cough and shortness of breath.   Cardiovascular: Negative for chest pain and palpitations.  Gastrointestinal: Negative for abdominal pain and vomiting.  Genitourinary: Negative for dysuria and hematuria.  Musculoskeletal: Negative for arthralgias and back pain.  Skin: Negative for color change and rash.  Neurological: Negative for seizures and syncope.  All other systems reviewed and are negative.   Physical Exam Updated Vital Signs BP 138/79   Pulse 74   Temp 98.1 F (36.7 C) (Oral)   Resp  11   Ht '5\' 6"'$  (1.676 m)   Wt 64.9 kg   SpO2 100%   BMI 23.08 kg/m   Physical Exam Vitals and nursing note reviewed.  Constitutional:      General: She is not in acute distress.    Appearance: She is well-developed and well-nourished. She is not ill-appearing or diaphoretic.  HENT:     Head: Normocephalic and atraumatic.  Eyes:     Conjunctiva/sclera: Conjunctivae normal.  Cardiovascular:     Rate and Rhythm: Normal rate and regular rhythm.     Heart sounds: No murmur heard.   Pulmonary:     Effort: Pulmonary effort is normal. No respiratory distress.     Breath sounds: Normal breath sounds.  Abdominal:     General:  Abdomen is flat.     Palpations: Abdomen is soft.     Tenderness: There is no abdominal tenderness.  Musculoskeletal:        General: Tenderness present. No edema. Normal range of motion.     Cervical back: Neck supple.     Comments: Right toe with no significant discoloration, no evidence of necrosis.<2 cap refill.  She does have an area of redness and mild weeping to the right upper toe pad.  Full range of motion of the toe.  Skin:    General: Skin is warm and dry.  Neurological:     General: No focal deficit present.     Mental Status: She is alert and oriented to person, place, and time.  Psychiatric:        Mood and Affect: Mood and affect normal.     ED Results / Procedures / Treatments   Labs (all labs ordered are listed, but only abnormal results are displayed) Labs Reviewed  BASIC METABOLIC PANEL - Abnormal; Notable for the following components:      Result Value   Sodium 121 (*)    Chloride 80 (*)    Glucose, Bld 705 (*)    BUN 94 (*)    Creatinine, Ser 4.72 (*)    Calcium 8.7 (*)    GFR, Estimated 11 (*)    All other components within normal limits  CBC - Abnormal; Notable for the following components:   RBC 3.75 (*)    Hemoglobin 11.0 (*)    HCT 33.3 (*)    All other components within normal limits  URINALYSIS, ROUTINE W REFLEX  MICROSCOPIC - Abnormal; Notable for the following components:   Glucose, UA >=500 (*)    Hgb urine dipstick SMALL (*)    Protein, ur 100 (*)    All other components within normal limits  CBG MONITORING, ED - Abnormal; Notable for the following components:   Glucose-Capillary >600 (*)    All other components within normal limits  I-STAT BETA HCG BLOOD, ED ([facility], [facility], [facility] ONLY) - Abnormal; Notable for the following components:   I-stat hCG, quantitative 5.4 (*)    All other components within normal limits  CBG MONITORING, ED - Abnormal; Notable for the following components:   Glucose-Capillary 568 (*)    All other components within normal limits  CBG MONITORING, ED - Abnormal; Notable for the following components:   Glucose-Capillary 524 (*)    All other components within normal limits  CBG MONITORING, ED - Abnormal; Notable for the following components:   Glucose-Capillary 480 (*)    All other components within normal limits    EKG None  Radiology DG Toe Great Right  Result Date: 04/14/2020 CLINICAL DATA:  Right toe pain. EXAM: RIGHT GREAT TOE COMPARISON:  None. FINDINGS: There is no evidence of fracture or dislocation. There is no evidence of arthropathy or other focal bone abnormality. Soft tissues are unremarkable. IMPRESSION: Negative. Electronically Signed   By: Dorise Bullion III M.D   On: 04/14/2020 17:28    Procedures Procedures   Medications Ordered in ED Medications  lactated ringers infusion ( Intravenous New Bag/Given 04/14/20 1422)  dextrose 5 % in lactated ringers infusion (has no administration in time range)  dextrose 50 % solution 0-50 mL (has no administration in time range)  insulin aspart (novoLOG) injection 10 Units (10 Units Subcutaneous Given 04/14/20 1458)    ED Course  I have reviewed the triage vital signs and the nursing notes.  Pertinent labs & imaging results that were available during my care of the patient were reviewed by me and considered in my  medical decision making (see chart for details).    MDM Rules/Calculators/A&P                         50 year old female who presents to the ER with complaints of hyperglycemia.  Had a lapse in her insulin, and has not taken it in the last several days.  On arrival, vitals overall reassuring.  She was overall very well-appearing, has no complaints at this time other than right toe pain which has been ongoing for several weeks.  Labs here not consistent with DKA or HHS, she is hyponatremic but her corrected sodium is 136.  Glucose of 705, creatinine of 4.72 which is slightly elevated from baseline.  She also has mild hypocalcemia of 8.7.  Negative anion gap, CO2 was normal.  UA without evidence of ketones, no evidence of UTI.  hCG here 5.4, I believe this is false.    She hated hyperglycemic fluid resuscitation, patient received 10 units of NovoLog here in the ER.  Improved CBG to 480.  Given complaints of right toe pain, I did order x-rays to check for osteomyelitis.  This was overall normal, no soft tissue swelling or gas.  Patient states that she has the insulin and information that she needs to initiate proper treatment when she gets home.  She is comfortable with going home and resuming her treatment.  We will start her on doxycycline for questionable toe infection in the setting of diabetes.  Stressed follow-up with her endocrinologist, return precautions discussed.  She voiced understanding is agreeable.  This was a shared visit with my supervising physician Dr. Ralene Bathe who independently saw and evaluated the patient & provided guidance in evaluation/management/disposition ,in agreement with care   Final Clinical Impression(s) / ED Diagnoses Final diagnoses:  Hyperglycemia  Great toe pain, right    Rx / DC Orders ED Discharge Orders         Ordered    doxycycline (VIBRAMYCIN) 100 MG capsule  2 times daily        04/14/20 1747           Lyndel Safe 04/14/20 1755    Quintella Reichert, MD 04/14/20 1905

## 2020-04-14 NOTE — Discharge Instructions (Signed)
Please make sure to resume your insulin as directed.  You may take the antibiotic prescribed until finished for your toe pain.  Please make sure to follow-up with your endocrinologist.  Return to the ER for any new or worsening symptoms.

## 2020-04-14 NOTE — Progress Notes (Addendum)
Inpatient Diabetes Program Recommendations  AACE/ADA: New Consensus Statement on Inpatient Glycemic Control (2015)  Target Ranges:  Prepandial:   less than 140 mg/dL      Peak postprandial:   less than 180 mg/dL (1-2 hours)      Critically ill patients:  140 - 180 mg/dL   Lab Results  Component Value Date   GLUCAP >600 ([place]) 04/14/2020   HGBA1C 8.7 (H) 11/24/2019    Review of Glycemic Control Results for LERIN, SCHURZ (MRN [id number]) as of 04/14/2020 15:00  Ref. Range 04/14/2020 12:47  Glucose Latest Ref Range: 70 - 99 mg/dL [facility])  Results for BLESSYN, ZWEIFEL (MRN [id number]) as of 04/14/2020 15:00  Ref. Range 04/14/2020 12:47  CO2 Latest Ref Range: 22 - 32 mmol/L 27  Results for JELISHA, RETALLICK (MRN [id number]) as of 04/14/2020 15:00  Ref. Range 04/14/2020 12:47  Anion gap Latest Ref Range: 5 - 15  14   Results for IFFANY, MCADAM (MRN [id number]) as of 04/14/2020 15:00  Ref. Range 04/14/2020 12:47  GFR, Estimated Latest Ref Range: >60 mL/min 11 (L)   Diabetes history: DM2  Outpatient Diabetes medications: Tresiba 7 units QHS, Fiasp 4 units TID  Referral for inpatient DM coordinator for hyperglycemia.  Attempted to call patient and no answer.    Will continue to try to reach patient.  Please consider IV insulin until CBG's are WNL then transition to sq.    Addendum @ 25-  Spoke with patient on the phone as this DM coordinator is working remotely.  Her endocrinologist recently stopped her Janumet 50-'1000mg'$  due to decreased kidney function.  She prescribed Fiasp 4 units TID with meals.  Patient states she was just recently prescribed this and has not taken it yet.  Her glucometer read high and she came to ED.  Explained she will need to take her Fiasp to avoid hyperglycemia.  She verbalizes understanding.  We discussed hypoglycemia, signs, symptoms and treatment.  She is aware she needs close follow up with PCP.   Will continue to follow while  inpatient.  Thank you, Reche Dixon, RN, BSN Diabetes Coordinator Inpatient Diabetes Program [phone] (team pager from 8a-5p)

## 2020-04-14 NOTE — ED Triage Notes (Signed)
Patient reports gap in treatment with regimen change for Type 2 diabetes. States glucometer read high this morning x2. Denies complaints. Hx stroke and hypertension.

## 2020-04-15 ENCOUNTER — Encounter (HOSPITAL_COMMUNITY): Payer: Self-pay | Admitting: Emergency Medicine

## 2020-04-15 ENCOUNTER — Inpatient Hospital Stay (HOSPITAL_COMMUNITY)
Admission: EM | Admit: 2020-04-15 | Discharge: 2020-04-20 | DRG: 637 | Disposition: A | Discharge status: Home / Self Care | Payer: BC Managed Care – PPO | Attending: Internal Medicine | Admitting: Internal Medicine

## 2020-04-15 ENCOUNTER — Other Ambulatory Visit: Payer: Self-pay

## 2020-04-15 DIAGNOSIS — N179 Acute kidney failure, unspecified: Secondary | ICD-10-CM | POA: Diagnosis present

## 2020-04-15 DIAGNOSIS — M19041 Primary osteoarthritis, right hand: Secondary | ICD-10-CM | POA: Diagnosis present

## 2020-04-15 DIAGNOSIS — F32A Depression, unspecified: Secondary | ICD-10-CM | POA: Diagnosis present

## 2020-04-15 DIAGNOSIS — Z881 Allergy status to other antibiotic agents status: Secondary | ICD-10-CM

## 2020-04-15 DIAGNOSIS — E1165 Type 2 diabetes mellitus with hyperglycemia: Principal | ICD-10-CM | POA: Diagnosis present

## 2020-04-15 DIAGNOSIS — Z87442 Personal history of urinary calculi: Secondary | ICD-10-CM

## 2020-04-15 DIAGNOSIS — N184 Chronic kidney disease, stage 4 (severe): Secondary | ICD-10-CM | POA: Diagnosis present

## 2020-04-15 DIAGNOSIS — E860 Dehydration: Secondary | ICD-10-CM | POA: Diagnosis present

## 2020-04-15 DIAGNOSIS — E785 Hyperlipidemia, unspecified: Secondary | ICD-10-CM | POA: Diagnosis present

## 2020-04-15 DIAGNOSIS — R8290 Unspecified abnormal findings in urine: Secondary | ICD-10-CM | POA: Diagnosis present

## 2020-04-15 DIAGNOSIS — Z794 Long term (current) use of insulin: Secondary | ICD-10-CM

## 2020-04-15 DIAGNOSIS — N132 Hydronephrosis with renal and ureteral calculous obstruction: Secondary | ICD-10-CM | POA: Diagnosis present

## 2020-04-15 DIAGNOSIS — I10 Essential (primary) hypertension: Secondary | ICD-10-CM

## 2020-04-15 DIAGNOSIS — R739 Hyperglycemia, unspecified: Secondary | ICD-10-CM | POA: Diagnosis present

## 2020-04-15 DIAGNOSIS — E1122 Type 2 diabetes mellitus with diabetic chronic kidney disease: Secondary | ICD-10-CM | POA: Diagnosis present

## 2020-04-15 DIAGNOSIS — E1100 Type 2 diabetes mellitus with hyperosmolarity without nonketotic hyperglycemic-hyperosmolar coma (NKHHC): Secondary | ICD-10-CM | POA: Diagnosis present

## 2020-04-15 DIAGNOSIS — Z7982 Long term (current) use of aspirin: Secondary | ICD-10-CM

## 2020-04-15 DIAGNOSIS — Z90710 Acquired absence of both cervix and uterus: Secondary | ICD-10-CM

## 2020-04-15 DIAGNOSIS — Z79899 Other long term (current) drug therapy: Secondary | ICD-10-CM

## 2020-04-15 DIAGNOSIS — Z20822 Contact with and (suspected) exposure to covid-19: Secondary | ICD-10-CM | POA: Diagnosis present

## 2020-04-15 DIAGNOSIS — R81 Glycosuria: Secondary | ICD-10-CM | POA: Diagnosis present

## 2020-04-15 DIAGNOSIS — D649 Anemia, unspecified: Secondary | ICD-10-CM | POA: Diagnosis present

## 2020-04-15 DIAGNOSIS — Z8249 Family history of ischemic heart disease and other diseases of the circulatory system: Secondary | ICD-10-CM

## 2020-04-15 DIAGNOSIS — Z8673 Personal history of transient ischemic attack (TIA), and cerebral infarction without residual deficits: Secondary | ICD-10-CM

## 2020-04-15 DIAGNOSIS — Z833 Family history of diabetes mellitus: Secondary | ICD-10-CM

## 2020-04-15 DIAGNOSIS — I129 Hypertensive chronic kidney disease with stage 1 through stage 4 chronic kidney disease, or unspecified chronic kidney disease: Secondary | ICD-10-CM | POA: Diagnosis present

## 2020-04-15 DIAGNOSIS — B351 Tinea unguium: Secondary | ICD-10-CM | POA: Diagnosis present

## 2020-04-15 LAB — CBC
HCT: 35.5 % — ABNORMAL LOW (ref 36.0–46.0)
Hemoglobin: 11.3 g/dL — ABNORMAL LOW (ref 12.0–15.0)
MCH: 29.3 pg (ref 26.0–34.0)
MCHC: 31.8 g/dL (ref 30.0–36.0)
MCV: 92 fL (ref 80.0–100.0)
Platelets: 339 10*3/uL (ref 150–400)
RBC: 3.86 MIL/uL — ABNORMAL LOW (ref 3.87–5.11)
RDW: 13.2 % (ref 11.5–15.5)
WBC: 7 10*3/uL (ref 4.0–10.5)
nRBC: 0 % (ref 0.0–0.2)

## 2020-04-15 LAB — CBG MONITORING, ED: Glucose-Capillary: >600 mg/dL (ref 70–99)

## 2020-04-15 LAB — BASIC METABOLIC PANEL
Anion gap: 17 — ABNORMAL HIGH (ref 5–15)
BUN: 103 mg/dL — ABNORMAL HIGH (ref 6–20)
CO2: 25 mmol/L (ref 22–32)
Calcium: 8.2 mg/dL — ABNORMAL LOW (ref 8.9–10.3)
Chloride: 81 mmol/L — ABNORMAL LOW (ref 98–111)
Creatinine, Ser: 5.41 mg/dL — ABNORMAL HIGH (ref 0.44–1.00)
GFR, Estimated: 9 mL/min — ABNORMAL LOW (ref >60)
Glucose, Bld: 744 mg/dL (ref 70–99)
Potassium: 4.4 mmol/L (ref 3.5–5.1)
Sodium: 123 mmol/L — ABNORMAL LOW (ref 135–145)

## 2020-04-15 LAB — I-STAT BETA HCG BLOOD, ED (MC, WL, AP ONLY): I-stat hCG, quantitative: <5 m[IU]/mL (ref <5)

## 2020-04-15 MED ORDER — SODIUM CHLORIDE 0.9 % IV BOLUS
1000.0000 mL | Freq: Once | INTRAVENOUS | Status: AC
  Administered 2020-04-16: 1000 mL via INTRAVENOUS

## 2020-04-15 NOTE — ED Provider Notes (Signed)
[facility] DEPT Provider Note   CSN: [id number] Arrival date & time: 04/15/20  2119     History Chief Complaint  Patient presents with  . Hyperglycemia    Felicia Guerrero is a 50 y.o. female.  The history is provided by the patient and medical records.    50 y.o. F with hx of anemia, CKD stage III, DM2, HTN, prior stroke, presenting to the ED with hyperglycemia.  Seen yesterday for same. States she was recently started on new insulin regimen but her pharmacy was closed yesterday when she left the hospital and was not aware it was closed on sundays so she has not had her new medications yet.  She denies nausea, vomiting.  She has been drinking lots of water but does not feel that her urine has increased any.  No fever/chills.  States she was concerned infection of right great toe may be affecting her sugars.  She was started on abx for this yesterday.  Past Medical History:  Diagnosis Date  . Anemia    hx of  . Arthritis    right pinky osteoarthritis  . Chronic kidney disease    stage 3 ckd  . Depression    hx of  . Diabetes mellitus without complication ([place])    type 2  . Heart murmur   . History of kidney stones   . Hypertension   . Pneumothorax   . Stroke [facility])    short term memory loss and left side weakness /blindness right eye   Nov 28 2014    Patient Active Problem List   Diagnosis Date Noted  . Pyelonephritis 02/17/2017  . Sepsis ([place]) 02/17/2017  . Acute renal insufficiency 02/17/2017  . Left ureteral stone 02/15/2017    Past Surgical History:  Procedure Laterality Date  . CARDIAC SURGERY     For pericardial effusion  . CESAREAN SECTION    . CHEST TUBE INSERTION    . CYSTOSCOPY W/ URETERAL STENT PLACEMENT Left 02/15/2017   Procedure: CYSTOSCOPY WITH LEFT URETERAL STENT PLACEMENT;  Surgeon: Raynelle Bring, MD;  Location: [facility];  Service: Urology;  Laterality: Left;  . CYSTOSCOPY/URETEROSCOPY/HOLMIUM LASER/STENT  PLACEMENT Left 03/12/2017   Procedure: CYSTOSCOPY/URETEROSCOPY/HOLMIUM LASER/STENT PLACEMENT;  Surgeon: Raynelle Bring, MD;  Location: [facility];  Service: Urology;  Laterality: Left;  . CYSTOSCOPY/URETEROSCOPY/HOLMIUM LASER/STENT PLACEMENT Left 11/28/2019   Procedure: CYSTOSCOPY/RETROGRADE/URETEROSCOPY;  Surgeon: Raynelle Bring, MD;  Location: [facility];  Service: Urology;  Laterality: Left;  . EYE SURGERY     right eye  . HYSTERECTOMY ABDOMINAL WITH SALPINGECTOMY    . KIDNEY STONE SURGERY    . PERICARDIAL WINDOW    . WISDOM TOOTH EXTRACTION       OB History   No obstetric history on file.     Family History  Problem Relation Age of Onset  . Diabetes Mother   . Congestive Heart Failure Mother   . Prostate cancer Father   . Diabetes Sister   . High blood pressure Brother     Social History   Tobacco Use  . Smoking status: Never Smoker  . Smokeless tobacco: Never Used  Vaping Use  . Vaping Use: Never used  Substance Use Topics  . Alcohol use: No  . Drug use: No    Home Medications Prior to Admission medications   Medication Sig Start Date End Date Taking? Authorizing Provider  acetaminophen (TYLENOL) 500 MG tablet Take 500 mg by mouth every 6 (six) hours as needed for moderate pain.  [provider]  aspirin 81 MG EC tablet Take 81 mg by mouth at bedtime.     [provider]  chlorthalidone (HYGROTON) 25 MG tablet Take 25 mg by mouth daily.    [provider]  ciclopirox (PENLAC) 8 % solution Apply 1 application topically 2 (two) times daily. 04/10/20   [provider]  clotrimazole-betamethasone (LOTRISONE) cream Apply 1 application topically 2 (two) times daily. 04/10/20   [provider]  doxycycline (VIBRAMYCIN) 100 MG capsule Take 1 capsule (100 mg total) by mouth 2 (two) times daily for 7 days. 04/14/20 04/21/20  Garald Balding, PA-C  Insulin Aspart, w/Niacinamide, (FIASP PENFILL) 100 UNIT/ML SOCT Inject 4 Units into the skin See  admin instructions. Before each meal 04/03/20   [provider]  Insulin Degludec (TRESIBA) 100 UNIT/ML SOLN Inject 7 Units into the skin at bedtime.    [provider]  lisinopril (ZESTRIL) 20 MG tablet Take 20 mg by mouth daily.     [provider]  Magnesium Oxide 420 MG TABS Take 420 mg by mouth daily.    [provider]  metoprolol succinate (TOPROL-XL) 50 MG 24 hr tablet Take 50 mg by mouth at bedtime. 09/09/19   [provider]  Multiple Vitamin (MULTIVITAMIN WITH MINERALS) TABS tablet Take 1 tablet by mouth daily.    [provider]  NIFEdipine (ADALAT CC) 90 MG 24 hr tablet Take 90 mg by mouth daily. 11/19/19   [provider]  rosuvastatin (CRESTOR) 20 MG tablet Take 20 mg by mouth every evening. 05/27/16   [provider]    Allergies    Cephalexin  Review of Systems   Review of Systems  Endocrine: Positive for polydipsia.       Hyperglycemia  All other systems reviewed and are negative.   Physical Exam Updated Vital Signs BP (!) 162/85 (BP Location: Right Arm)   Pulse (!) 107   Temp 98.4 F (36.9 C) (Oral)   Resp 18   Ht '5\' 6"'$  (1.676 m)   Wt 64.9 kg   SpO2 99%   BMI 23.08 kg/m   Physical Exam Vitals and nursing note reviewed.  Constitutional:      Appearance: She is well-developed and well-nourished.  HENT:     Head: Normocephalic and atraumatic.     Mouth/Throat:     Mouth: Oropharynx is clear and moist.  Eyes:     Extraocular Movements: EOM normal.     Conjunctiva/sclera: Conjunctivae normal.     Pupils: Pupils are equal, round, and reactive to light.  Cardiovascular:     Rate and Rhythm: Normal rate and regular rhythm.     Heart sounds: Normal heart sounds.  Pulmonary:     Effort: Pulmonary effort is normal.     Breath sounds: Normal breath sounds.  Abdominal:     General: Bowel sounds are normal.     Palpations: Abdomen is soft.  Musculoskeletal:        General: Normal range of  motion.     Cervical back: Normal range of motion.     Comments: Right great toenail is broken horizontally, there is some yellow discoloration but no noted drainage, swelling, redness, or tenderness to touch, normal cap refill No skin breakdown or ulceration of the soft tissue of toe, no macerated tissue between toes  Skin:    General: Skin is warm and dry.  Neurological:     Mental Status: She is alert and oriented to person, place,  and time.  Psychiatric:        Mood and Affect: Mood and affect normal.     ED Results / Procedures / Treatments   Labs (all labs ordered are listed, but only abnormal results are displayed) Labs Reviewed  BASIC METABOLIC PANEL - Abnormal; Notable for the following components:      Result Value   Sodium 123 (*)    Chloride 81 (*)    Glucose, Bld 744 (*)    BUN 103 (*)    Creatinine, Ser 5.41 (*)    Calcium 8.2 (*)    GFR, Estimated 9 (*)    Anion gap 17 (*)    All other components within normal limits  CBC - Abnormal; Notable for the following components:   RBC 3.86 (*)    Hemoglobin 11.3 (*)    HCT 35.5 (*)    All other components within normal limits  BLOOD GAS, VENOUS - Abnormal; Notable for the following components:   Bicarbonate 30.6 (*)    Acid-Base Excess 4.5 (*)    All other components within normal limits  CBG MONITORING, ED - Abnormal; Notable for the following components:   Glucose-Capillary >600 (*)    All other components within normal limits  RESP PANEL BY RT-PCR (FLU A&B, COVID) ARPGX2  URINALYSIS, ROUTINE W REFLEX MICROSCOPIC  BETA-HYDROXYBUTYRIC ACID  I-STAT BETA HCG BLOOD, ED ([facility], [facility], [facility] ONLY)  CBG MONITORING, ED    EKG None  Radiology DG Toe Great Right  Result Date: 04/14/2020 CLINICAL DATA:  Right toe pain. EXAM: RIGHT GREAT TOE COMPARISON:  None. FINDINGS: There is no evidence of fracture or dislocation. There is no evidence of arthropathy or other focal bone abnormality. Soft tissues are unremarkable.  IMPRESSION: Negative. Electronically Signed   By: Dorise Bullion III M.D   On: 04/14/2020 17:28    Procedures Procedures   CRITICAL CARE Performed by: Larene Pickett   Total critical care time: 45 minutes  Critical care time was exclusive of separately billable procedures and treating other patients.  Critical care was necessary to treat or prevent imminent or life-threatening deterioration.  Critical care was time spent personally by me on the following activities: development of treatment plan with patient and/or surrogate as well as nursing, discussions with consultants, evaluation of patient's response to treatment, examination of patient, obtaining history from patient or surrogate, ordering and performing treatments and interventions, ordering and review of laboratory studies, ordering and review of radiographic studies, pulse oximetry and re-evaluation of patient's condition.  Medications Ordered in ED Medications  sodium chloride 0.9 % bolus 1,000 mL (1,000 mLs Intravenous New Bag/Given 04/16/20 0118)    Followed by  sodium chloride 0.9 % bolus 1,000 mL (has no administration in time range)  insulin regular, human (MYXREDLIN) 100 units/ 100 mL infusion (has no administration in time range)  dextrose 5 % in lactated ringers infusion (has no administration in time range)  dextrose 50 % solution 0-50 mL (has no administration in time range)  0.9 %  sodium chloride infusion (has no administration in time range)    ED Course  I have reviewed the triage vital signs and the nursing notes.  Pertinent labs & imaging results that were available during my care of the patient were reviewed by me and considered in my medical decision making (see chart for details).    MDM Rules/Calculators/A&P  50 year old female presenting to the ED with hyperglycemia.  Seen in ED yesterday for same with some mild  improvement.  States she started on the insulin regimen but has been unable to take  this up in the pharmacy yet.  States her continuous glucometer today continued reading "high".  She does report drinking lots of water but denies any increase in urination.  She is afebrile and nontoxic in appearance here.  Does have concern of wound of right toe--it appears the nail on right great toe is broken horizontally with some yellow discoloration she has been told by PCP that this was likely fungal and was prescribed topical treatment.  She had an x-ray yesterday that did not show any signs of osteomyelitis.  There is no ulceration or open wounds of the soft tissue of the toe.  Labs today with worsening renal function and glucose today up to 744.  Gap is 17 but normal bicarb.  VBG with reassuring pH so doubt DKA. At this point, patient will require admission to get glucose under control with aggressive hydration.  Will give 2 L bolus and start insulin drip.  She is agreeable to this.  Discussed with Dr. Marlowe Sax-- will admit for ongoing care.  Final Clinical Impression(s) / ED Diagnoses Final diagnoses:  Hyperglycemia    Rx / DC Orders ED Discharge Orders    None       Larene Pickett, PA-C 04/16/20 0122    Shanon Rosser, MD 04/16/20 [place] [place]

## 2020-04-15 NOTE — ED Triage Notes (Signed)
Patient reports CBG reading high at home. Started new insulin regimen today. Seen for same yesterday.

## 2020-04-16 ENCOUNTER — Other Ambulatory Visit: Payer: Self-pay

## 2020-04-16 ENCOUNTER — Observation Stay (HOSPITAL_COMMUNITY): Payer: BC Managed Care – PPO

## 2020-04-16 ENCOUNTER — Encounter (HOSPITAL_COMMUNITY): Payer: Self-pay | Admitting: Internal Medicine

## 2020-04-16 DIAGNOSIS — B351 Tinea unguium: Secondary | ICD-10-CM

## 2020-04-16 DIAGNOSIS — E1165 Type 2 diabetes mellitus with hyperglycemia: Secondary | ICD-10-CM | POA: Diagnosis present

## 2020-04-16 DIAGNOSIS — I10 Essential (primary) hypertension: Secondary | ICD-10-CM

## 2020-04-16 DIAGNOSIS — N179 Acute kidney failure, unspecified: Secondary | ICD-10-CM

## 2020-04-16 DIAGNOSIS — R739 Hyperglycemia, unspecified: Secondary | ICD-10-CM

## 2020-04-16 DIAGNOSIS — E785 Hyperlipidemia, unspecified: Secondary | ICD-10-CM

## 2020-04-16 LAB — HIV ANTIBODY (ROUTINE TESTING W REFLEX): HIV Screen 4th Generation wRfx: NONREACTIVE

## 2020-04-16 LAB — CBG MONITORING, ED
Glucose-Capillary: >600 mg/dL (ref 70–99)
Glucose-Capillary: 543 mg/dL (ref 70–99)
Glucose-Capillary: 401 mg/dL — ABNORMAL HIGH (ref 70–99)
Glucose-Capillary: 450 mg/dL — ABNORMAL HIGH (ref 70–99)
Glucose-Capillary: 287 mg/dL — ABNORMAL HIGH (ref 70–99)
Glucose-Capillary: 143 mg/dL — ABNORMAL HIGH (ref 70–99)
Glucose-Capillary: 112 mg/dL — ABNORMAL HIGH (ref 70–99)
Glucose-Capillary: 183 mg/dL — ABNORMAL HIGH (ref 70–99)
Glucose-Capillary: 195 mg/dL — ABNORMAL HIGH (ref 70–99)
Glucose-Capillary: 168 mg/dL — ABNORMAL HIGH (ref 70–99)
Glucose-Capillary: 139 mg/dL — ABNORMAL HIGH (ref 70–99)
Glucose-Capillary: 91 mg/dL (ref 70–99)

## 2020-04-16 LAB — BLOOD GAS, VENOUS
Acid-Base Excess: 4.5 mmol/L — ABNORMAL HIGH (ref 0.0–2.0)
Bicarbonate: 30.6 mmol/L — ABNORMAL HIGH (ref 20.0–28.0)
FIO2: 21
O2 Saturation: 63.6 %
Patient temperature: 98.6
pCO2, Ven: 56.6 mmHg (ref 44.0–60.0)
pH, Ven: 7.353 (ref 7.250–7.430)
pO2, Ven: 35.9 mmHg (ref 32.0–45.0)

## 2020-04-16 LAB — URINALYSIS, ROUTINE W REFLEX MICROSCOPIC
Bilirubin Urine: NEGATIVE
Glucose, UA: >=500 mg/dL — AB
Ketones, ur: NEGATIVE mg/dL
Nitrite: NEGATIVE
Protein, ur: 30 mg/dL — AB
Specific Gravity, Urine: 1.011 (ref 1.005–1.030)
pH: 5 (ref 5.0–8.0)

## 2020-04-16 LAB — GLUCOSE, CAPILLARY
Glucose-Capillary: 197 mg/dL — ABNORMAL HIGH (ref 70–99)
Glucose-Capillary: 381 mg/dL — ABNORMAL HIGH (ref 70–99)

## 2020-04-16 LAB — BASIC METABOLIC PANEL
Anion gap: 12 (ref 5–15)
BUN: 96 mg/dL — ABNORMAL HIGH (ref 6–20)
CO2: 26 mmol/L (ref 22–32)
Calcium: 8.1 mg/dL — ABNORMAL LOW (ref 8.9–10.3)
Chloride: 94 mmol/L — ABNORMAL LOW (ref 98–111)
Creatinine, Ser: 4.73 mg/dL — ABNORMAL HIGH (ref 0.44–1.00)
GFR, Estimated: 11 mL/min — ABNORMAL LOW (ref >60)
Glucose, Bld: 266 mg/dL — ABNORMAL HIGH (ref 70–99)
Potassium: 3.1 mmol/L — ABNORMAL LOW (ref 3.5–5.1)
Sodium: 132 mmol/L — ABNORMAL LOW (ref 135–145)

## 2020-04-16 LAB — RESP PANEL BY RT-PCR (FLU A&B, COVID) ARPGX2
Influenza A by PCR: NEGATIVE
Influenza B by PCR: NEGATIVE
SARS Coronavirus 2 by RT PCR: NEGATIVE

## 2020-04-16 LAB — BETA-HYDROXYBUTYRIC ACID: Beta-Hydroxybutyric Acid: 0.17 mmol/L (ref 0.05–0.27)

## 2020-04-16 LAB — CREATININE, URINE, RANDOM: Creatinine, Urine: 23.35 mg/dL

## 2020-04-16 LAB — SODIUM, URINE, RANDOM: Sodium, Ur: 87 mmol/L

## 2020-04-16 LAB — OSMOLALITY: Osmolality: 321 mOsm/kg (ref 275–295)

## 2020-04-16 MED ORDER — METOPROLOL SUCCINATE ER 50 MG PO TB24
50.0000 mg | ORAL_TABLET | Freq: Every day | ORAL | Status: DC
  Administered 2020-04-16 – 2020-04-19 (×4): 50 mg via ORAL
  Filled 2020-04-16 (×4): qty 1

## 2020-04-16 MED ORDER — DEXTROSE IN LACTATED RINGERS 5 % IV SOLN: INTRAVENOUS | Status: DC

## 2020-04-16 MED ORDER — ASPIRIN EC 81 MG PO TBEC
81.0000 mg | DELAYED_RELEASE_TABLET | Freq: Every day | ORAL | Status: DC
Start: 2020-04-16 — End: 2020-04-20
  Administered 2020-04-16 – 2020-04-19 (×4): 81 mg via ORAL
  Filled 2020-04-16 (×4): qty 1

## 2020-04-16 MED ORDER — NIFEDIPINE ER OSMOTIC RELEASE 60 MG PO TB24
90.0000 mg | ORAL_TABLET | Freq: Every day | ORAL | Status: DC
  Administered 2020-04-16 – 2020-04-20 (×5): 90 mg via ORAL
  Filled 2020-04-16 (×5): qty 1

## 2020-04-16 MED ORDER — INSULIN ASPART 100 UNIT/ML ~~LOC~~ SOLN
0.0000 [IU] | Freq: Three times a day (TID) | SUBCUTANEOUS | Status: DC
  Administered 2020-04-16: 3 [IU] via SUBCUTANEOUS
  Administered 2020-04-16: 2 [IU] via SUBCUTANEOUS
  Administered 2020-04-16: 3 [IU] via SUBCUTANEOUS
  Administered 2020-04-17: 15 [IU] via SUBCUTANEOUS
  Administered 2020-04-17: 8 [IU] via SUBCUTANEOUS
  Administered 2020-04-17: 3 [IU] via SUBCUTANEOUS
  Administered 2020-04-18: 11 [IU] via SUBCUTANEOUS
  Filled 2020-04-16: qty 0.15

## 2020-04-16 MED ORDER — INSULIN REGULAR(HUMAN) IN NACL 100-0.9 UT/100ML-% IV SOLN
INTRAVENOUS | Status: DC
  Administered 2020-04-16: 8 [IU]/h via INTRAVENOUS
  Filled 2020-04-16: qty 100

## 2020-04-16 MED ORDER — DEXTROSE 50 % IV SOLN: 0.0000 mL | INTRAVENOUS | Status: DC | PRN

## 2020-04-16 MED ORDER — SODIUM CHLORIDE 0.9 % IV SOLN: INTRAVENOUS | Status: DC

## 2020-04-16 MED ORDER — HEPARIN SODIUM (PORCINE) 5000 UNIT/ML IJ SOLN
5000.0000 [IU] | Freq: Three times a day (TID) | INTRAMUSCULAR | Status: DC
  Administered 2020-04-16 – 2020-04-20 (×12): 5000 [IU] via SUBCUTANEOUS
  Filled 2020-04-16 (×13): qty 1

## 2020-04-16 MED ORDER — SODIUM CHLORIDE 0.9 % IV SOLN: INTRAVENOUS | Status: AC

## 2020-04-16 MED ORDER — DEXTROSE-NACL 5-0.45 % IV SOLN: INTRAVENOUS | Status: DC

## 2020-04-16 MED ORDER — INSULIN GLARGINE 100 UNIT/ML ~~LOC~~ SOLN
10.0000 [IU] | Freq: Every day | SUBCUTANEOUS | Status: DC
  Administered 2020-04-16: 10 [IU] via SUBCUTANEOUS
  Filled 2020-04-16 (×2): qty 0.1

## 2020-04-16 MED ORDER — INSULIN ASPART 100 UNIT/ML ~~LOC~~ SOLN
0.0000 [IU] | Freq: Every day | SUBCUTANEOUS | Status: DC
  Administered 2020-04-16: 4 [IU] via SUBCUTANEOUS
  Administered 2020-04-17 – 2020-04-19 (×3): 3 [IU] via SUBCUTANEOUS
  Filled 2020-04-16: qty 0.05

## 2020-04-16 MED ORDER — ROSUVASTATIN CALCIUM 20 MG PO TABS
20.0000 mg | ORAL_TABLET | Freq: Every evening | ORAL | Status: DC
  Administered 2020-04-16 – 2020-04-19 (×4): 20 mg via ORAL
  Filled 2020-04-16 (×5): qty 1

## 2020-04-16 MED ORDER — CICLOPIROX 8 % EX SOLN
1.0000 "application " | Freq: Two times a day (BID) | CUTANEOUS | Status: DC
  Administered 2020-04-17 – 2020-04-20 (×6): 1 via TOPICAL

## 2020-04-16 MED ORDER — INSULIN REGULAR(HUMAN) IN NACL 100-0.9 UT/100ML-% IV SOLN: INTRAVENOUS | Status: DC

## 2020-04-16 NOTE — Progress Notes (Signed)
Patient arrives to room 1536 at this time via wheelchair from ED.  Patient independent to bed from chair.  Patient oriented to callbell use and bed controls with stated understanding

## 2020-04-16 NOTE — ED Notes (Signed)
[place] DO notified of critical lab value; serum osmolality: 320

## 2020-04-16 NOTE — Progress Notes (Signed)
PROGRESS NOTE    Felicia Guerrero  [id number] DOB: 07/25/1970 DOA: 04/15/2020 PCP: Bartholome Bill, MD    Brief Narrative:  Felicia Guerrero is a 50 y.o. female with medical history significant of CKD stage IV, hypertension, hyperlipidemia, insulin-dependent diabetes, CVA presenting to the ED with complaint of hyperglycemia.  She was seen in the ED 2 days ago for the same problem but was not in DKA, treated with IV fluid and insulin, and discharged.  Patient states her endocrinologist recently changed her insulin regimen but she was never able to pick it up from the pharmacy due to insurance problems.  Finally she started the new regimen yesterday but when she checked her blood sugar at night it was elevated.  States her diet has not changed.  States her primary care doctor recently diagnosed with a fungal infection of her right great toe for which she is using a topical antifungal medication.  She is fully vaccinated against COVID including booster shot.  Denies fevers, cough, shortness of breath, chest pain, nausea, vomiting, abdominal pain, diarrhea, or dysuria.  She has no trouble making urine.  Her nephrologist is Dr. Moshe Cipro and she has been evaluated for kidney transplant at [facility].  In the ED, temperature 98.4, HR 107, RR 18, BP 162/85, SPO2 99% on room air. WBC 7.0, hemoglobin 11.3 (at baseline), platelet count 339K.  Sodium 123, potassium 4.4, chloride 81, bicarb 25, anion gap 17, BUN 103, creatinine 5.4, glucose 744.  Beta hydroxybutyric acid within normal range.  UA with glucosuria but no ketones, negative nitrite, large amount of leukocytes, 6-10 WBCs, and rare bacteria.  VBG with pH 7.35.  SARS-CoV-2 PCR test negative. Patient was given 2 L normal saline boluses and started on insulin drip.  Hospitalist service consulted for further evaluation and management of hyperglycemic crisis and acute on chronic renal failure   Assessment & Plan:   Principal Problem:    Hyperglycemia Active Problems:   AKI (acute kidney injury) ([place])   Onychomycosis   HTN (hypertension)   HLD (hyperlipidemia)   Hyperglycemic crisis in diabetes mellitus ([place])   Hyperglycemic crisis in the setting of poorly controlled insulin-dependent diabetes:  Hemoglobin A1c was 8.7 in September 2021.  Currently on Tresiba 7 units of tensely daily at home. Blood glucose significantly elevated at 744 but no signs of DKA with normal bicarb, beta hydroxybutyric acid within normal range, UA without ketones, and VBG with normal pH.  --Repeat hemoglobin A1c: Pending --Transition insulin drip to Lantus 10 units [place] daily --Moderate SSI for further coverage --CBGs before every meal/at bedtime  Acute renal failure on CKD stage IV:  BUN 103, creatinine 5.4.  Creatinine was 3.6 on 03/20/2020.    Etiology likely secondary to poor oral intake in the setting of hyperglycemic crisis as above.  Renal ultrasound with mild right hydronephrosis with multiple bilateral shadowing calculi up to 4.6 mm on the right and 9.9 mm on the left without obstruction seen, increased renal cortical echogenicity consistent with medical renal disease.  Patient follows with nephrology outpatient, Dr. Moshe Cipro. --Cr 5.41>4.75 --NS at 100 Ml/hr --Avoid nephrotoxins, renal dose all medications --Strict I's and O's --BMP daily  Onychomycosis of right great toenail:  Patient noted to have a horizontally broken toenail with yellow discoloration for which she was prescribed a topical antifungal treatment by her PCP.  No signs of cellulitis.  X-ray done during ED visit 2 days ago did not show any signs of osteomyelitis. -Continue topical antifungal agent.  Abnormal  urinalysis:  UA with negative nitrite, large amount of leukocytes, 6-10 WBCs, and rare bacteria.  Patient is not endorsing any UTI symptoms.  No fever or leukocytosis. --Urine culture: Pending --No antibiotic therapy indicated at this time  Hypertension:   --Continue home metoprolol succinate 50 mg p.o. daily and nifedipine XL 90 mg p.o. daily.  --Hold chlorthalidone and lisinopril given AKI. --Continue aspirin and statin  Hyperlipidemia --Crestor 20 mg p.o. daily   DVT prophylaxis: Heparin   Code Status: Full Code Family Communication: Updated patient extensively at bedside, she will update family members  Disposition Plan:  Level of care: Med-Surg Status is: Observation  The patient remains OBS appropriate and will d/c before 2 midnights.  Dispo: The patient is from: Home              Anticipated d/c is to: Home              Anticipated d/c date is: 1 day              Patient currently is not medically stable to d/c.   Difficult to place patient No   Consultants:   None  Procedures:   None  Antimicrobials:   None   Subjective: Patient seen and examined bedside, resting comfortably.  Glucose much improved.  Continues on insulin drip.  Discussed with patient will transition off insulin drip this morning.  Will need to monitor glucose over the next 24 hours and adjust as needed to ensure adequate coverage when she discharges home.  No other questions or concerns at this time.  Denies headache, no visual changes, no chest pain, palpitations, no shortness of breath, no abdominal pain, no weakness, no fatigue, no paresthesias.  No acute events overnight per nursing staff.  Objective: Vitals:   04/16/20 0117 04/16/20 0424 04/16/20 0715 04/16/20 1048  BP: 124/78 137/74 131/68 (!) 141/79  Pulse: 88 78 78   Resp: '17 17 10   '$ Temp:      TempSrc:      SpO2: 100% 100% 100%   Weight:      Height:        Intake/Output Summary (Last 24 hours) at 04/16/2020 1112 Last data filed at 04/16/2020 0412 Gross per 24 hour  Intake 1100 ml  Output --  Net 1100 ml   Filed Weights   04/15/20 2126  Weight: 64.9 kg    Examination:  General exam: Appears calm and comfortable  Respiratory system: Clear to auscultation. Respiratory  effort normal.  Oxygenating well on room air Cardiovascular system: S1 & S2 heard, RRR. No JVD, murmurs, rubs, gallops or clicks. No pedal edema. Gastrointestinal system: Abdomen is nondistended, soft and nontender. No organomegaly or masses felt. Normal bowel sounds heard. Central nervous system: Alert and oriented. No focal neurological deficits. Extremities: Symmetric 5 x 5 power. Skin: No rashes, lesions or ulcers Psychiatry: Judgement and insight appear normal. Mood & affect appropriate.     Data Reviewed: I have personally reviewed following labs and imaging studies  CBC: Recent Labs  Lab 04/14/20 1247 04/15/20 2127  WBC 7.5 7.0  HGB 11.0* 11.3*  HCT 33.3* 35.5*  MCV 88.8 92.0  PLT 332 [id number]   Basic Metabolic Panel: Recent Labs  Lab 04/14/20 1247 04/15/20 2127 04/16/20 0426  NA 121* 123* 132*  K 4.7 4.4 3.1*  CL 80* 81* 94*  CO2 '27 25 26  '$ GLUCOSE 705* 744* 266*  BUN 94* 103* 96*  CREATININE 4.72* 5.41* 4.73*  CALCIUM 8.7* 8.2*  8.1*   GFR: Estimated Creatinine Clearance: 13.3 mL/min (A) (by C-G formula based on SCr of 4.73 mg/dL (H)). Liver Function Tests: No results for input(s): AST, ALT, ALKPHOS, BILITOT, PROT, ALBUMIN in the last 168 hours. No results for input(s): LIPASE, AMYLASE in the last 168 hours. No results for input(s): AMMONIA in the last 168 hours. Coagulation Profile: No results for input(s): INR, PROTIME in the last 168 hours. Cardiac Enzymes: No results for input(s): CKTOTAL, CKMB, CKMBINDEX, TROPONINI in the last 168 hours. BNP (last 3 results) No results for input(s): PROBNP in the last 8760 hours. HbA1C: No results for input(s): HGBA1C in the last 72 hours. CBG: Recent Labs  Lab 04/16/20 0627 04/16/20 0734 04/16/20 0831 04/16/20 0937 04/16/20 1044  GLUCAP 112* 183* 195* 168* 139*   Lipid Profile: No results for input(s): CHOL, HDL, LDLCALC, TRIG, CHOLHDL, LDLDIRECT in the last 72 hours. Thyroid Function Tests: No results for  input(s): TSH, T4TOTAL, FREET4, T3FREE, THYROIDAB in the last 72 hours. Anemia Panel: No results for input(s): VITAMINB12, FOLATE, FERRITIN, TIBC, IRON, RETICCTPCT in the last 72 hours. Sepsis Labs: No results for input(s): PROCALCITON, LATICACIDVEN in the last 168 hours.  Recent Results (from the past 240 hour(s))  Resp Panel by RT-PCR (Flu A&B, Covid) Nasopharyngeal Swab     Status: None   Collection Time: 04/15/20 11:22 PM   Specimen: Nasopharyngeal Swab; Nasopharyngeal(NP) swabs in vial transport medium  Result Value Ref Range Status   SARS Coronavirus 2 by RT PCR NEGATIVE NEGATIVE Final    Comment: (NOTE) SARS-CoV-2 target nucleic acids are NOT DETECTED.  The SARS-CoV-2 RNA is generally detectable in upper respiratory specimens during the acute phase of infection. The lowest concentration of SARS-CoV-2 viral copies this assay can detect is 138 copies/mL. A negative result does not preclude SARS-Cov-2 infection and should not be used as the sole basis for treatment or other patient management decisions. A negative result may occur with  improper specimen collection/handling, submission of specimen other than nasopharyngeal swab, presence of viral mutation(s) within the areas targeted by this assay, and inadequate number of viral copies(<138 copies/mL). A negative result must be combined with clinical observations, patient history, and epidemiological information. The expected result is Negative.  Fact Sheet for Patients:  EntrepreneurPulse.com.au  Fact Sheet for Healthcare Providers:  IncredibleEmployment.be  This test is no t yet approved or cleared by the [place] FDA and  has been authorized for detection and/or diagnosis of SARS-CoV-2 by FDA under an Emergency Use Authorization (EUA). This EUA will remain  in effect (meaning this test can be used) for the duration of the COVID-19 declaration under Section 564(b)(1) of the Act,  21 U.S.C.section 360bbb-3(b)(1), unless the authorization is terminated  or revoked sooner.       Influenza A by PCR NEGATIVE NEGATIVE Final   Influenza B by PCR NEGATIVE NEGATIVE Final    Comment: (NOTE) The Xpert Xpress SARS-CoV-2/FLU/RSV plus assay is intended as an aid in the diagnosis of influenza from Nasopharyngeal swab specimens and should not be used as a sole basis for treatment. Nasal washings and aspirates are unacceptable for Xpert Xpress SARS-CoV-2/FLU/RSV testing.  Fact Sheet for Patients: EntrepreneurPulse.com.au  Fact Sheet for Healthcare Providers: IncredibleEmployment.be  This test is not yet approved or cleared by the [place] FDA and has been authorized for detection and/or diagnosis of SARS-CoV-2 by FDA under an Emergency Use Authorization (EUA). This EUA will remain in effect (meaning this test can be used) for the duration of the  COVID-19 declaration under Section 564(b)(1) of the Act, 21 U.S.C. section 360bbb-3(b)(1), unless the authorization is terminated or revoked.  Performed at [facility], [place] [address], [place], [place] [address]          Radiology Studies: US RENAL  Result Date: 04/16/2020 CLINICAL DATA:  Acute kidney injury, none urolithiasis, prior cystoscopy and stent placement EXAM: RENAL / URINARY TRACT ULTRASOUND COMPLETE COMPARISON:  CT 11/17/2019 FINDINGS: Right Kidney: Renal measurements: 11.4 x 4.9 x 5.1 cm = volume: 150.9 mL. Diffusely increased renal cortical echogenicity. Mild hydronephrosis. Multiple echogenic, shadowing calculi measuring up to 4.6 mm in diameter seen within the interpolar and lower pole right kidney. No concerning renal mass. Left Kidney: Renal measurements: 10.3 x 5.4 x 6.3 cm = volume: 182 mL. Diffusely increased renal cortical echogenicity. No hydronephrosis or concerning renal mass. Multiple shadowing calculi, largest in the lower pole measuring up  to 9.9 mm. Bladder: Bilateral bladder jets identified. Urinary bladder unremarkable for degree of distension. Other: None. IMPRESSION: 1. Mild right hydronephrosis though visualization of the right bladder jet argues against a complete obstruction. 2. Multiple bilateral shadowing calculi measuring up to 4.6 mm on the right and 9.9 mm on the left. 3. Increased renal cortical echogenicity bilaterally, nonspecific but can be seen in the setting of medical renal disease. Electronically Signed   By: Lovena Le M.D.   On: 04/16/2020 06:00   DG Toe Great Right  Result Date: 04/14/2020 CLINICAL DATA:  Right toe pain. EXAM: RIGHT GREAT TOE COMPARISON:  None. FINDINGS: There is no evidence of fracture or dislocation. There is no evidence of arthropathy or other focal bone abnormality. Soft tissues are unremarkable. IMPRESSION: Negative. Electronically Signed   By: Dorise Bullion III M.D   On: 04/14/2020 17:28        Scheduled Meds: . aspirin EC  81 mg Oral QHS  . ciclopirox  1 application Topical BID  . heparin  5,000 Units Subcutaneous Q8H  . insulin aspart  0-15 Units Subcutaneous TID WC  . insulin aspart  0-5 Units Subcutaneous QHS  . insulin glargine  10 Units Subcutaneous Daily  . metoprolol succinate  50 mg Oral QHS  . NIFEdipine  90 mg Oral Daily  . rosuvastatin  20 mg Oral QPM   Continuous Infusions:   LOS: 0 days    Time spent: 38 minutes spent on chart review, discussion with nursing staff, consultants, updating family and interview/physical exam; more than 50% of that time was spent in counseling and/or coordination of care.    [employer]y Rothschild J [place] DO [facility] Available via Epic secure chat 7am-7pm After these hours, please refer to coverage provider listed on [email or web address] 04/16/2020, 11:12 AM

## 2020-04-16 NOTE — ED Notes (Signed)
Called report to [place], [place]

## 2020-04-16 NOTE — H&P (Signed)
History and Physical    Felicia Guerrero [id number] DOB: 10/04/1970 DOA: 04/15/2020  PCP: Bartholome Bill, MD Patient coming from: Home  Chief Complaint: Hyperglycemia  HPI: Felicia Guerrero is a 50 y.o. female with medical history significant of CKD stage IV, hypertension, hyperlipidemia, insulin-dependent diabetes, CVA presenting to the ED with complaint of hyperglycemia.  She was seen in the ED 2 days ago for the same problem but was not in DKA, treated with IV fluid and insulin, and discharged.  Patient states her endocrinologist recently changed her insulin regimen but she was never able to pick it up from the pharmacy due to insurance problems.  Finally she started the new regimen yesterday but when she checked her blood sugar at night it was elevated.  States her diet has not changed.  States her primary care doctor recently diagnosed with a fungal infection of her right great toe for which she is using a topical antifungal medication.  She is fully vaccinated against COVID including booster shot.  Denies fevers, cough, shortness of breath, chest pain, nausea, vomiting, abdominal pain, diarrhea, or dysuria.  She has no trouble making urine.  Her nephrologist is Dr. Moshe Cipro and she has been evaluated for kidney transplant at [facility].  ED Course: Slightly tachycardic on arrival, remainder of vital signs stable.  WBC 7.0, hemoglobin 11.3 (at baseline), platelet count 339K.  Sodium 123, potassium 4.4, chloride 81, bicarb 25, anion gap 17, BUN 103, creatinine 5.4, glucose 744.  Beta hydroxybutyric acid within normal range.  UA with glucosuria but no ketones, negative nitrite, large amount of leukocytes, 6-10 WBCs, and rare bacteria.  VBG with pH 7.35.  SARS-CoV-2 PCR test pending. Patient was given 2 L normal saline boluses and started on insulin drip.  Review of Systems:  All systems reviewed and apart from history of presenting illness, are negative.  Past Medical History:   Diagnosis Date  . Anemia    hx of  . Arthritis    right pinky osteoarthritis  . Chronic kidney disease    stage 3 ckd  . Depression    hx of  . Diabetes mellitus without complication ([place])    type 2  . Heart murmur   . History of kidney stones   . Hypertension   . Pneumothorax   . Stroke [facility])    short term memory loss and left side weakness /blindness right eye   Nov 28 2014    Past Surgical History:  Procedure Laterality Date  . CARDIAC SURGERY     For pericardial effusion  . CESAREAN SECTION    . CHEST TUBE INSERTION    . CYSTOSCOPY W/ URETERAL STENT PLACEMENT Left 02/15/2017   Procedure: CYSTOSCOPY WITH LEFT URETERAL STENT PLACEMENT;  Surgeon: Raynelle Bring, MD;  Location: [facility];  Service: Urology;  Laterality: Left;  . CYSTOSCOPY/URETEROSCOPY/HOLMIUM LASER/STENT PLACEMENT Left 03/12/2017   Procedure: CYSTOSCOPY/URETEROSCOPY/HOLMIUM LASER/STENT PLACEMENT;  Surgeon: Raynelle Bring, MD;  Location: [facility];  Service: Urology;  Laterality: Left;  . CYSTOSCOPY/URETEROSCOPY/HOLMIUM LASER/STENT PLACEMENT Left 11/28/2019   Procedure: CYSTOSCOPY/RETROGRADE/URETEROSCOPY;  Surgeon: Raynelle Bring, MD;  Location: [facility];  Service: Urology;  Laterality: Left;  . EYE SURGERY     right eye  . HYSTERECTOMY ABDOMINAL WITH SALPINGECTOMY    . KIDNEY STONE SURGERY    . PERICARDIAL WINDOW    . WISDOM TOOTH EXTRACTION       reports that she has never smoked. She has never used smokeless tobacco. She reports that she does not drink  alcohol and does not use drugs.  Allergies  Allergen Reactions  . Cephalexin Diarrhea    Family History  Problem Relation Age of Onset  . Diabetes Mother   . Congestive Heart Failure Mother   . Prostate cancer Father   . Diabetes Sister   . High blood pressure Brother     Prior to Admission medications   Medication Sig Start Date End Date Taking? Authorizing Provider  acetaminophen (TYLENOL) 500 MG tablet Take 500 mg by mouth every 6 (six) hours as  needed for moderate pain.   Yes [provider]  aspirin 81 MG EC tablet Take 81 mg by mouth at bedtime.    Yes [provider]  chlorthalidone (HYGROTON) 25 MG tablet Take 25 mg by mouth daily.   Yes [provider]  ciclopirox (PENLAC) 8 % solution Apply 1 application topically 2 (two) times daily. 04/10/20  Yes [provider]  clotrimazole-betamethasone (LOTRISONE) cream Apply 1 application topically 2 (two) times daily. 04/10/20  Yes [provider]  doxycycline (VIBRAMYCIN) 100 MG capsule Take 1 capsule (100 mg total) by mouth 2 (two) times daily for 7 days. 04/14/20 04/21/20 Yes Garald Balding, PA-C  epoetin alfa (EPOGEN) 2000 UNIT/ML injection Inject 2,000 Units into the skin every 28 (twenty-eight) days.   Yes [provider]  Insulin Aspart, w/Niacinamide, (FIASP PENFILL) 100 UNIT/ML SOCT Inject 4 Units into the skin See admin instructions. Before each meal 04/03/20  Yes [provider]  Insulin Degludec (TRESIBA) 100 UNIT/ML SOLN Inject 7 Units into the skin at bedtime.   Yes [provider]  lisinopril (ZESTRIL) 20 MG tablet Take 20 mg by mouth daily.    Yes [provider]  Magnesium Oxide 420 MG TABS Take 420 mg by mouth daily.   Yes [provider]  metoprolol succinate (TOPROL-XL) 50 MG 24 hr tablet Take 50 mg by mouth at bedtime. 09/09/19  Yes [provider]  Multiple Vitamin (MULTIVITAMIN WITH MINERALS) TABS tablet Take 1 tablet by mouth daily.   Yes [provider]  NIFEdipine (ADALAT CC) 90 MG 24 hr tablet Take 90 mg by mouth daily. 11/19/19  Yes [provider]  rosuvastatin (CRESTOR) 20 MG tablet Take 20 mg by mouth every evening. 05/27/16  Yes [provider]    Physical Exam: Vitals:   04/15/20 2126 04/15/20 2340 04/15/20 2352 04/16/20 0117  BP: (!) 162/85 (!) 142/74 132/69 124/78  Pulse: (!) 107 79 85 88  Resp: '18 12 10 17  '$ Temp: 98.4 F (36.9 C) 98.5  F (36.9 C)    TempSrc: Oral Oral    SpO2: 99% 100% 100% 100%  Weight: 64.9 kg     Height: '5\' 6"'$  (1.676 m)       Physical Exam Constitutional:      General: She is not in acute distress. HENT:     Head: Normocephalic and atraumatic.     Mouth/Throat:     Mouth: Mucous membranes are moist.  Eyes:     Extraocular Movements: Extraocular movements intact.     Conjunctiva/sclera: Conjunctivae normal.  Cardiovascular:     Rate and Rhythm: Normal rate and regular rhythm.     Pulses: Normal pulses.  Pulmonary:     Effort: Pulmonary effort is normal. No respiratory distress.     Breath sounds: Normal breath sounds. No wheezing or rales.  Abdominal:     General: Bowel sounds are normal. There is no distension.     Palpations:  Abdomen is soft.     Tenderness: There is no abdominal tenderness.  Musculoskeletal:        General: No swelling or tenderness.     Cervical back: Normal range of motion and neck supple.  Skin:    General: Skin is warm and dry.     Comments: Right great toe: Toenail horizontally broken with yellow discoloration.  No erythema, pus drainage, or signs of infection.  Neurological:     General: No focal deficit present.     Mental Status: She is alert and oriented to person, place, and time.     Labs on Admission: I have personally reviewed following labs and imaging studies  CBC: Recent Labs  Lab 04/14/20 1247 04/15/20 2127  WBC 7.5 7.0  HGB 11.0* 11.3*  HCT 33.3* 35.5*  MCV 88.8 92.0  PLT 332 [id number]   Basic Metabolic Panel: Recent Labs  Lab 04/14/20 1247 04/15/20 2127  NA 121* 123*  K 4.7 4.4  CL 80* 81*  CO2 27 25  GLUCOSE 705* 744*  BUN 94* 103*  CREATININE 4.72* 5.41*  CALCIUM 8.7* 8.2*   GFR: Estimated Creatinine Clearance: 11.6 mL/min (A) (by C-G formula based on SCr of 5.41 mg/dL (H)). Liver Function Tests: No results for input(s): AST, ALT, ALKPHOS, BILITOT, PROT, ALBUMIN in the last 168 hours. No results for input(s): LIPASE,  AMYLASE in the last 168 hours. No results for input(s): AMMONIA in the last 168 hours. Coagulation Profile: No results for input(s): INR, PROTIME in the last 168 hours. Cardiac Enzymes: No results for input(s): CKTOTAL, CKMB, CKMBINDEX, TROPONINI in the last 168 hours. BNP (last 3 results) No results for input(s): PROBNP in the last 8760 hours. HbA1C: No results for input(s): HGBA1C in the last 72 hours. CBG: Recent Labs  Lab 04/15/20 2125 04/16/20 0127 04/16/20 0209 04/16/20 0234 04/16/20 0304  GLUCAP >600* >600* 543* 450* 401*   Lipid Profile: No results for input(s): CHOL, HDL, LDLCALC, TRIG, CHOLHDL, LDLDIRECT in the last 72 hours. Thyroid Function Tests: No results for input(s): TSH, T4TOTAL, FREET4, T3FREE, THYROIDAB in the last 72 hours. Anemia Panel: No results for input(s): VITAMINB12, FOLATE, FERRITIN, TIBC, IRON, RETICCTPCT in the last 72 hours. Urine analysis:    Component Value Date/Time   COLORURINE YELLOW 04/16/2020 0003   APPEARANCEUR CLEAR 04/16/2020 0003   LABSPEC 1.011 04/16/2020 0003   PHURINE 5.0 04/16/2020 0003   GLUCOSEU >=500 (A) 04/16/2020 0003   HGBUR SMALL (A) 04/16/2020 0003   BILIRUBINUR NEGATIVE 04/16/2020 0003   KETONESUR NEGATIVE 04/16/2020 0003   PROTEINUR 30 (A) 04/16/2020 0003   UROBILINOGEN 0.2 12/19/2015 1917   NITRITE NEGATIVE 04/16/2020 0003   LEUKOCYTESUR LARGE (A) 04/16/2020 0003    Radiological Exams on Admission: DG Toe Great Right  Result Date: 04/14/2020 CLINICAL DATA:  Right toe pain. EXAM: RIGHT GREAT TOE COMPARISON:  None. FINDINGS: There is no evidence of fracture or dislocation. There is no evidence of arthropathy or other focal bone abnormality. Soft tissues are unremarkable. IMPRESSION: Negative. Electronically Signed   By: Dorise Bullion III M.D   On: 04/14/2020 17:28    Assessment/Plan Principal Problem:   Hyperglycemia Active Problems:   AKI (acute kidney injury) ([place])   Onychomycosis   HTN (hypertension)    HLD (hyperlipidemia)   Hyperglycemic crisis in the setting of poorly controlled insulin-dependent diabetes: A1c was 8.7 in September 2021.  Blood glucose significantly elevated at 744 but no signs of DKA -bicarb normal, beta hydroxybutyric acid within normal range,  UA without ketones, and VBG with normal pH. ?HHS.  -Check serum osmolarity.  Keep n.p.o. Continue insulin drip and IV fluids.  Initiate diet and subcutaneous insulin when hyperglycemia resolves.  Consult diabetes coordinator.  AKI on CKD stage IV: BUN 103, creatinine 5.4.  Creatinine was 3.6 on 03/20/2020.  Unclear what has caused worsening of her renal function the patient is not endorsing any symptoms to suggest dehydration such as poor oral intake, vomiting, diarrhea, or polyuria. -IV fluid hydration.  Monitor renal function and urine output.  Avoid nephrotoxic agents/contrast.  Hold home chlorthalidone and lisinopril.  Check urine sodium and creatinine.  Order renal ultrasound.  Consult nephrology in a.m.  Onychomycosis of right great toenail: Patient noted to have a horizontally broken toenail with yellow discoloration for which she was prescribed a topical antifungal treatment by her PCP.  No signs of cellulitis.  X-ray done during ED visit 2 days ago did not show any signs of osteomyelitis. -Continue topical antifungal agent.  Abnormal urinalysis: UA with negative nitrite, large amount of leukocytes, 6-10 WBCs, and rare bacteria.  Patient is not endorsing any UTI symptoms.  No fever or leukocytosis. -Add on urine culture  Hypertension: Stable.   -Continue home metoprolol and nifedipine.  Hold chlorthalidone and lisinopril given AKI.  Hyperlipidemia -Continue Crestor  DVT prophylaxis: Subcutaneous heparin Code Status: Full code Family Communication: No family available at this time. Disposition Plan: Status is: Observation  The patient remains OBS appropriate and will d/c before 2 midnights.  Dispo: The patient is from:  Home              Anticipated d/c is to: Home              Anticipated d/c date is: 2 days              Patient currently is not medically stable to d/c.   Difficult to place patient No  Level of care: Level of care: Stepdown   The medical decision making on this patient was of high complexity and the patient is at high risk for clinical deterioration, therefore this is a level 3 visit.  Shela Leff MD [facility]  If 7PM-7AM, please contact night-coverage [email or web address]  04/16/2020, 3:52 AM

## 2020-04-16 NOTE — ED Notes (Signed)
Patient ambulatory to [id number]. Pt reports high blood glucose. Denies any complications at this time. NAD. A&Ox4. Respirations regular/unlabored. Connected to cardiac monitor, BP, and pulse ox. Stretcher low, wheels locked, call bell within reach.

## 2020-04-16 NOTE — Plan of Care (Signed)
  Problem: Education: Goal: Knowledge of General Education information will improve Description Including pain rating scale, medication(s)/side effects and non-pharmacologic comfort measures Outcome: Progressing   

## 2020-04-17 ENCOUNTER — Encounter (HOSPITAL_COMMUNITY): Payer: BC Managed Care – PPO

## 2020-04-17 DIAGNOSIS — R739 Hyperglycemia, unspecified: Secondary | ICD-10-CM | POA: Diagnosis not present

## 2020-04-17 LAB — GLUCOSE, CAPILLARY
Glucose-Capillary: 286 mg/dL — ABNORMAL HIGH (ref 70–99)
Glucose-Capillary: 394 mg/dL — ABNORMAL HIGH (ref 70–99)
Glucose-Capillary: 180 mg/dL — ABNORMAL HIGH (ref 70–99)
Glucose-Capillary: 261 mg/dL — ABNORMAL HIGH (ref 70–99)

## 2020-04-17 LAB — HEMOGLOBIN A1C
Hgb A1c MFr Bld: 12.4 % — ABNORMAL HIGH (ref 4.8–5.6)
Mean Plasma Glucose: 309 mg/dL

## 2020-04-17 LAB — BASIC METABOLIC PANEL
Anion gap: 11 (ref 5–15)
BUN: 89 mg/dL — ABNORMAL HIGH (ref 6–20)
CO2: 26 mmol/L (ref 22–32)
Calcium: 8.3 mg/dL — ABNORMAL LOW (ref 8.9–10.3)
Chloride: 97 mmol/L — ABNORMAL LOW (ref 98–111)
Creatinine, Ser: 4.47 mg/dL — ABNORMAL HIGH (ref 0.44–1.00)
GFR, Estimated: 11 mL/min — ABNORMAL LOW (ref >60)
Glucose, Bld: 322 mg/dL — ABNORMAL HIGH (ref 70–99)
Potassium: 3.6 mmol/L (ref 3.5–5.1)
Sodium: 134 mmol/L — ABNORMAL LOW (ref 135–145)

## 2020-04-17 LAB — URINE CULTURE: Culture: <10000 — AB

## 2020-04-17 MED ORDER — INSULIN ASPART 100 UNIT/ML ~~LOC~~ SOLN
3.0000 [IU] | Freq: Three times a day (TID) | SUBCUTANEOUS | Status: DC
  Administered 2020-04-17 – 2020-04-18 (×3): 3 [IU] via SUBCUTANEOUS

## 2020-04-17 MED ORDER — INSULIN GLARGINE 100 UNIT/ML ~~LOC~~ SOLN
5.0000 [IU] | Freq: Once | SUBCUTANEOUS | Status: AC
  Administered 2020-04-17: 5 [IU] via SUBCUTANEOUS
  Filled 2020-04-17: qty 0.05

## 2020-04-17 MED ORDER — INSULIN GLARGINE 100 UNIT/ML ~~LOC~~ SOLN
15.0000 [IU] | Freq: Every day | SUBCUTANEOUS | Status: DC
  Administered 2020-04-17: 15 [IU] via SUBCUTANEOUS
  Filled 2020-04-17: qty 0.15

## 2020-04-17 MED ORDER — INSULIN GLARGINE 100 UNIT/ML ~~LOC~~ SOLN
20.0000 [IU] | Freq: Every day | SUBCUTANEOUS | Status: DC
  Filled 2020-04-17: qty 0.2

## 2020-04-17 NOTE — Progress Notes (Addendum)
PROGRESS NOTE    [name]  [id number] DOB: Nov 24, 1970 DOA: 04/15/2020 PCP: Bartholome Bill, MD    Brief Narrative:  Felicia Guerrero is a 50 y.o. female with medical history significant of CKD stage IV, hypertension, hyperlipidemia, insulin-dependent diabetes, CVA presenting to the ED with complaint of hyperglycemia.  She was seen in the ED 2 days ago for the same problem but was not in DKA, treated with IV fluid and insulin, and discharged.  Patient states her endocrinologist recently changed her insulin regimen but she was never able to pick it up from the pharmacy due to insurance problems.  Finally she started the new regimen yesterday but when she checked her blood sugar at night it was elevated.  States her diet has not changed.  States her primary care doctor recently diagnosed with a fungal infection of her right great toe for which she is using a topical antifungal medication.  She is fully vaccinated against COVID including booster shot.  Denies fevers, cough, shortness of breath, chest pain, nausea, vomiting, abdominal pain, diarrhea, or dysuria.  She has no trouble making urine.  Her nephrologist is Dr. Moshe Cipro and she has been evaluated for kidney transplant at [facility].  In the ED, temperature 98.4, HR 107, RR 18, BP 162/85, SPO2 99% on room air. WBC 7.0, hemoglobin 11.3 (at baseline), platelet count 339K.  Sodium 123, potassium 4.4, chloride 81, bicarb 25, anion gap 17, BUN 103, creatinine 5.4, glucose 744.  Beta hydroxybutyric acid within normal range.  UA with glucosuria but no ketones, negative nitrite, large amount of leukocytes, 6-10 WBCs, and rare bacteria.  VBG with pH 7.35.  SARS-CoV-2 PCR test negative. Patient was given 2 L normal saline boluses and started on insulin drip.  Hospitalist service consulted for further evaluation and management of hyperglycemic crisis and acute on chronic renal failure   Assessment & Plan:   Principal Problem:    Hyperglycemia Active Problems:   AKI (acute kidney injury) ([place])   Onychomycosis   HTN (hypertension)   HLD (hyperlipidemia)   Hyperglycemic crisis in diabetes mellitus ([place])   Hyperosmolar hyperglycemic crisis in the setting of poorly controlled insulin-dependent diabetes:  Hemoglobin A1c was 8.7 in September 2021; now up to 12.4.  Currently on Tresiba 7 u DC daily at home. Blood glucose significantly elevated at 744 but no signs of DKA with normal bicarb, beta hydroxybutyric acid within normal range, UA without ketones, and VBG with normal pH.   Serum osmolality elevated at 320. --Diabetic educator following, appreciate assistance --Transition insulin drip to Lantus 20 units [place] daily and Novolog 3u TID AC --Moderate SSI for further coverage --CBGs before every meal/at bedtime  Acute renal failure on CKD stage IV:  BUN 103, creatinine 5.4.  Creatinine was 3.6 on 03/20/2020.    Etiology likely secondary to poor oral intake in the setting of hyperglycemic crisis as above.  Renal ultrasound with mild right hydronephrosis with multiple bilateral shadowing calculi up to 4.6 mm on the right and 9.9 mm on the left without obstruction seen, increased renal cortical echogenicity consistent with medical renal disease.  Patient follows with nephrology outpatient, Dr. Moshe Cipro. --Cr 5.41>4.75>4.47 --IVF now dc'd; since tolerating diet --Avoid nephrotoxins, renal dose all medications --Strict I's and O's --BMP daily  Onychomycosis of right great toenail:  Patient noted to have a horizontally broken toenail with yellow discoloration for which she was prescribed a topical antifungal treatment by her PCP.  No signs of cellulitis.  X-ray done during  ED visit 2 days ago did not show any signs of osteomyelitis. -Continue topical antifungal agent.  Abnormal urinalysis:  UA with negative nitrite, large amount of leukocytes, 6-10 WBCs, and rare bacteria.  Patient is not endorsing any UTI symptoms.  No  fever or leukocytosis.  Urine culture with less than 10K insignificant growth. --No antibiotic therapy indicated at this time  Hypertension:  --Continue home metoprolol succinate 50 mg p.o. daily and nifedipine XL 90 mg p.o. daily.  --Hold chlorthalidone and lisinopril given AKI. --Continue aspirin and statin  Hyperlipidemia --Crestor 20 mg p.o. daily   DVT prophylaxis: Heparin   Code Status: Full Code Family Communication: Updated patient extensively at bedside  Disposition Plan:  Level of care: Med-Surg Status is: Observation  The patient remains OBS appropriate and will d/c before 2 midnights.  Dispo: The patient is from: Home              Anticipated d/c is to: Home              Anticipated d/c date is: 1 day              Patient currently is not medically stable to d/c.  Need further titration of insulin regimen trending of renal function   Difficult to place patient No   Consultants:   None  Procedures:   None  Antimicrobials:   None   Subjective: Patient seen and examined bedside, resting comfortably.  Concerned about her jumping glucose falling discontinuation of insulin drip yesterday.  Discussed with her that diabetes is extremely poorly controlled given her hemoglobin A1c now 12.4, and discussed with her this did not happen overnight.  Discussed with patient will continue to slowly titrate up insulin regimen to achieve a more reasonable glucose level, but will not be perfect at time of discharge.  Questions or concerns at this time. Denies headache, no visual changes, no chest pain, palpitations, no shortness of breath, no abdominal pain, no weakness, no fatigue, no paresthesias.  No acute events overnight per nursing staff.  Objective: Vitals:   04/16/20 1300 04/16/20 1413 04/16/20 2017 04/17/20 0316  BP: 134/72 137/74 129/72 (!) 154/84  Pulse: 82 92 83 74  Resp: '11 20 15 15  '$ Temp:  98.6 F (37 C) 99.1 F (37.3 C) 97.7 F (36.5 C)  TempSrc:   Oral  Oral  SpO2: 100% 100% 100% 100%  Weight:      Height:        Intake/Output Summary (Last 24 hours) at 04/17/2020 1151 Last data filed at 04/17/2020 0700 Gross per 24 hour  Intake 1964.06 ml  Output --  Net 1964.06 ml   Filed Weights   04/15/20 2126  Weight: 64.9 kg    Examination:  General exam: Appears calm and comfortable  Respiratory system: Clear to auscultation. Respiratory effort normal.  Oxygenating well on room air Cardiovascular system: S1 & S2 heard, RRR. No JVD, murmurs, rubs, gallops or clicks. No pedal edema. Gastrointestinal system: Abdomen is nondistended, soft and nontender. No organomegaly or masses felt. Normal bowel sounds heard. Central nervous system: Alert and oriented. No focal neurological deficits. Extremities: Symmetric 5 x 5 power. Skin: No rashes, lesions or ulcers Psychiatry: Judgement and insight appear normal. Mood & affect appropriate.     Data Reviewed: I have personally reviewed following labs and imaging studies  CBC: Recent Labs  Lab 04/14/20 1247 04/15/20 2127  WBC 7.5 7.0  HGB 11.0* 11.3*  HCT 33.3* 35.5*  MCV 88.8 92.0  PLT 332 99991111   Basic Metabolic Panel: Recent Labs  Lab 04/14/20 1247 04/15/20 2127 04/16/20 0426 04/17/20 0353  NA 121* 123* 132* 134*  K 4.7 4.4 3.1* 3.6  CL 80* 81* 94* 97*  CO2 '27 25 26 26  '$ GLUCOSE 705* 744* 266* 322*  BUN 94* 103* 96* 89*  CREATININE 4.72* 5.41* 4.73* 4.47*  CALCIUM 8.7* 8.2* 8.1* 8.3*   GFR: Estimated Creatinine Clearance: 14.1 mL/min (A) (by C-G formula based on SCr of 4.47 mg/dL (H)). Liver Function Tests: No results for input(s): AST, ALT, ALKPHOS, BILITOT, PROT, ALBUMIN in the last 168 hours. No results for input(s): LIPASE, AMYLASE in the last 168 hours. No results for input(s): AMMONIA in the last 168 hours. Coagulation Profile: No results for input(s): INR, PROTIME in the last 168 hours. Cardiac Enzymes: No results for input(s): CKTOTAL, CKMB, CKMBINDEX, TROPONINI in  the last 168 hours. BNP (last 3 results) No results for input(s): PROBNP in the last 8760 hours. HbA1C: Recent Labs    04/16/20 0426  HGBA1C 12.4*   CBG: Recent Labs  Lab 04/16/20 1217 04/16/20 1700 04/16/20 2123 04/17/20 0738 04/17/20 1109  GLUCAP 91 197* 381* 286* 394*   Lipid Profile: No results for input(s): CHOL, HDL, LDLCALC, TRIG, CHOLHDL, LDLDIRECT in the last 72 hours. Thyroid Function Tests: No results for input(s): TSH, T4TOTAL, FREET4, T3FREE, THYROIDAB in the last 72 hours. Anemia Panel: No results for input(s): VITAMINB12, FOLATE, FERRITIN, TIBC, IRON, RETICCTPCT in the last 72 hours. Sepsis Labs: No results for input(s): PROCALCITON, LATICACIDVEN in the last 168 hours.  Recent Results (from the past 240 hour(s))  Resp Panel by RT-PCR (Flu A&B, Covid) Nasopharyngeal Swab     Status: None   Collection Time: 04/15/20 11:22 PM   Specimen: Nasopharyngeal Swab; Nasopharyngeal(NP) swabs in vial transport medium  Result Value Ref Range Status   SARS Coronavirus 2 by RT PCR NEGATIVE NEGATIVE Final    Comment: (NOTE) SARS-CoV-2 target nucleic acids are NOT DETECTED.  The SARS-CoV-2 RNA is generally detectable in upper respiratory specimens during the acute phase of infection. The lowest concentration of SARS-CoV-2 viral copies this assay can detect is 138 copies/mL. A negative result does not preclude SARS-Cov-2 infection and should not be used as the sole basis for treatment or other patient management decisions. A negative result may occur with  improper specimen collection/handling, submission of specimen other than nasopharyngeal swab, presence of viral mutation(s) within the areas targeted by this assay, and inadequate number of viral copies(<138 copies/mL). A negative result must be combined with clinical observations, patient history, and epidemiological information. The expected result is Negative.  Fact Sheet for Patients:   EntrepreneurPulse.com.au  Fact Sheet for Healthcare Providers:  IncredibleEmployment.be  This test is no t yet approved or cleared by the [place] FDA and  has been authorized for detection and/or diagnosis of SARS-CoV-2 by FDA under an Emergency Use Authorization (EUA). This EUA will remain  in effect (meaning this test can be used) for the duration of the COVID-19 declaration under Section 564(b)(1) of the Act, 21 U.S.C.section 360bbb-3(b)(1), unless the authorization is terminated  or revoked sooner.       Influenza A by PCR NEGATIVE NEGATIVE Final   Influenza B by PCR NEGATIVE NEGATIVE Final    Comment: (NOTE) The Xpert Xpress SARS-CoV-2/FLU/RSV plus assay is intended as an aid in the diagnosis of influenza from Nasopharyngeal swab specimens and should not be used as a sole basis for treatment. Nasal washings and aspirates  are unacceptable for Xpert Xpress SARS-CoV-2/FLU/RSV testing.  Fact Sheet for Patients: EntrepreneurPulse.com.au  Fact Sheet for Healthcare Providers: IncredibleEmployment.be  This test is not yet approved or cleared by the [place] FDA and has been authorized for detection and/or diagnosis of SARS-CoV-2 by FDA under an Emergency Use Authorization (EUA). This EUA will remain in effect (meaning this test can be used) for the duration of the COVID-19 declaration under Section 564(b)(1) of the Act, 21 U.S.C. section 360bbb-3(b)(1), unless the authorization is terminated or revoked.  Performed at [facility], [place] [address], [place], [place] [address]   Culture, Urine     Status: Abnormal   Collection Time: 04/16/20  4:26 AM   Specimen: Urine, Clean Catch  Result Value Ref Range Status   Specimen Description   Final    URINE, CLEAN CATCH Performed at [facility], [place] [address] [place], [place] [address]    Special Requests    Final    NONE Performed at [facility], [place] [address], [place], [place] [address]    Culture (A)  Final    <10,000 COLONIES/mL INSIGNIFICANT GROWTH Performed at [place] [address] [place], [place] [address]    Report Status 04/17/2020 FINAL  Final         Radiology Studies: US RENAL  Result Date: 04/16/2020 CLINICAL DATA:  Acute kidney injury, none urolithiasis, prior cystoscopy and stent placement EXAM: RENAL / URINARY TRACT ULTRASOUND COMPLETE COMPARISON:  CT 11/17/2019 FINDINGS: Right Kidney: Renal measurements: 11.4 x 4.9 x 5.1 cm = volume: 150.9 mL. Diffusely increased renal cortical echogenicity. Mild hydronephrosis. Multiple echogenic, shadowing calculi measuring up to 4.6 mm in diameter seen within the interpolar and lower pole right kidney. No concerning renal mass. Left Kidney: Renal measurements: 10.3 x 5.4 x 6.3 cm = volume: 182 mL. Diffusely increased renal cortical echogenicity. No hydronephrosis or concerning renal mass. Multiple shadowing calculi, largest in the lower pole measuring up to 9.9 mm. Bladder: Bilateral bladder jets identified. Urinary bladder unremarkable for degree of distension. Other: None. IMPRESSION: 1. Mild right hydronephrosis though visualization of the right bladder jet argues against a complete obstruction. 2. Multiple bilateral shadowing calculi measuring up to 4.6 mm on the right and 9.9 mm on the left. 3. Increased renal cortical echogenicity bilaterally, nonspecific but can be seen in the setting of medical renal disease. Electronically Signed   By: Lovena Le M.D.   On: 04/16/2020 06:00        Scheduled Meds: . aspirin EC  81 mg Oral QHS  . ciclopirox  1 application Topical BID  . heparin  5,000 Units Subcutaneous Q8H  . insulin aspart  0-15 Units Subcutaneous TID WC  . insulin aspart  0-5 Units Subcutaneous QHS  . insulin aspart  3 Units Subcutaneous TID WC  . [START ON 04/18/2020] insulin glargine  20  Units Subcutaneous Daily  . insulin glargine  5 Units Subcutaneous Once  . metoprolol succinate  50 mg Oral QHS  . NIFEdipine  90 mg Oral Daily  . rosuvastatin  20 mg Oral QPM   Continuous Infusions:   LOS: 0 days    Time spent: 40 minutes spent on chart review, discussion with nursing staff, consultants, updating family and interview/physical exam; more than 50% of that time was spent in counseling and/or coordination of care.    Lameka Disla J [place] DO [facility] Available via Epic secure chat 7am-7pm After these hours, please refer to  coverage provider listed on [email or web address] 04/17/2020, 11:51 AM

## 2020-04-17 NOTE — Progress Notes (Signed)
Inpatient Diabetes Program Recommendations  AACE/ADA: New Consensus Statement on Inpatient Glycemic Control (2015)  Target Ranges:  Prepandial:   less than 140 mg/dL      Peak postprandial:   less than 180 mg/dL (1-2 hours)      Critically ill patients:  140 - 180 mg/dL   Lab Results  Component Value Date   GLUCAP 381 (H) 04/16/2020   HGBA1C 12.4 (H) 04/16/2020    Review of Glycemic Control  Diabetes history: DM2 Outpatient Diabetes medications: Tresiba 7 units QHS, Fiasp 4 units TID with meals Current orders for Inpatient glycemic control: Lantus 15 units QD, Novolog 0-15 units TID with meals and 0-5 HS  HgbA1C - 12.4% Endo recently d/ced Janumet 50-1000 mg d/t decreased kidney function. Has not started taking Fiasp yet. Glucometer read high and she came to ED. Diabetes Coordinator explained that she needs to take her Fiasp to avoid hyperglycemia. She is aware she needs close f/u with PCP.  Inpatient Diabetes Program Recommendations:     Add Novolog 3 units TID with meals.  For discharge:  Tresiba 15 units QD Fiasp 5 units TID with meals  Will see pt this am to answer any questions and reinforce importance of taking insulin as prescribed.  Thank you. Lorenda Peck, RD, LDN, CDE Inpatient Diabetes Coordinator [phone]

## 2020-04-18 DIAGNOSIS — R739 Hyperglycemia, unspecified: Secondary | ICD-10-CM | POA: Diagnosis not present

## 2020-04-18 DIAGNOSIS — I10 Essential (primary) hypertension: Secondary | ICD-10-CM

## 2020-04-18 DIAGNOSIS — N179 Acute kidney failure, unspecified: Secondary | ICD-10-CM | POA: Diagnosis not present

## 2020-04-18 LAB — BASIC METABOLIC PANEL
Anion gap: 13 (ref 5–15)
BUN: 87 mg/dL — ABNORMAL HIGH (ref 6–20)
CO2: 25 mmol/L (ref 22–32)
Calcium: 9.1 mg/dL (ref 8.9–10.3)
Chloride: 99 mmol/L (ref 98–111)
Creatinine, Ser: 3.77 mg/dL — ABNORMAL HIGH (ref 0.44–1.00)
GFR, Estimated: 14 mL/min — ABNORMAL LOW (ref >60)
Glucose, Bld: 330 mg/dL — ABNORMAL HIGH (ref 70–99)
Potassium: 4 mmol/L (ref 3.5–5.1)
Sodium: 137 mmol/L (ref 135–145)

## 2020-04-18 LAB — GLUCOSE, CAPILLARY
Glucose-Capillary: 314 mg/dL — ABNORMAL HIGH (ref 70–99)
Glucose-Capillary: 313 mg/dL — ABNORMAL HIGH (ref 70–99)
Glucose-Capillary: 78 mg/dL (ref 70–99)
Glucose-Capillary: 296 mg/dL — ABNORMAL HIGH (ref 70–99)

## 2020-04-18 MED ORDER — INSULIN GLARGINE 100 UNIT/ML ~~LOC~~ SOLN
24.0000 [IU] | Freq: Every day | SUBCUTANEOUS | Status: DC
  Administered 2020-04-18: 24 [IU] via SUBCUTANEOUS
  Filled 2020-04-18: qty 0.24

## 2020-04-18 MED ORDER — INSULIN ASPART 100 UNIT/ML ~~LOC~~ SOLN
0.0000 [IU] | Freq: Three times a day (TID) | SUBCUTANEOUS | Status: DC
  Administered 2020-04-18: 15 [IU] via SUBCUTANEOUS
  Administered 2020-04-19: 11 [IU] via SUBCUTANEOUS
  Administered 2020-04-19: 7 [IU] via SUBCUTANEOUS
  Administered 2020-04-20: 4 [IU] via SUBCUTANEOUS
  Administered 2020-04-20: 7 [IU] via SUBCUTANEOUS

## 2020-04-18 MED ORDER — INSULIN GLARGINE 100 UNIT/ML ~~LOC~~ SOLN: 28.0000 [IU] | Freq: Every day | SUBCUTANEOUS | Status: DC

## 2020-04-18 MED ORDER — INSULIN ASPART 100 UNIT/ML ~~LOC~~ SOLN
5.0000 [IU] | Freq: Three times a day (TID) | SUBCUTANEOUS | Status: DC
  Administered 2020-04-18: 5 [IU] via SUBCUTANEOUS

## 2020-04-18 MED ORDER — INSULIN GLARGINE 100 UNIT/ML ~~LOC~~ SOLN
22.0000 [IU] | Freq: Every day | SUBCUTANEOUS | Status: DC
  Administered 2020-04-19: 22 [IU] via SUBCUTANEOUS
  Filled 2020-04-18: qty 0.22

## 2020-04-18 MED ORDER — INSULIN GLARGINE 100 UNIT/ML ~~LOC~~ SOLN: 24.0000 [IU] | Freq: Every day | SUBCUTANEOUS | Status: DC

## 2020-04-18 MED ORDER — INSULIN ASPART 100 UNIT/ML ~~LOC~~ SOLN
3.0000 [IU] | Freq: Three times a day (TID) | SUBCUTANEOUS | Status: DC
  Administered 2020-04-19 – 2020-04-20 (×4): 3 [IU] via SUBCUTANEOUS

## 2020-04-18 NOTE — Telephone Encounter (Signed)
From pt

## 2020-04-18 NOTE — Telephone Encounter (Signed)
I am not sure about what this is. I do not need access to her epic, I am able to see all

## 2020-04-18 NOTE — Progress Notes (Signed)
PROGRESS NOTE    Felicia Guerrero  [id number] DOB: [birth year] DOA: 04/15/2020 PCP: Bartholome Bill, MD    Chief Complaint  Patient presents with  . Hyperglycemia    Brief Narrative: 50 year old lady prior history of stage IV CKD, hypertension, hyperlipidemia, insulin-dependent diabetes, CVA presents to ED with hyperglycemia and AKI.  She follows up with Dr. Moshe Cipro as outpatient.  CBG's are improving.   Assessment & Plan:   Principal Problem:   Hyperglycemia Active Problems:   AKI (acute kidney injury) ([place])   Onychomycosis   HTN (hypertension)   HLD (hyperlipidemia)   Hyperglycemic crisis in diabetes mellitus ([place])   AKI on stage 4 CKD.  Admitted with a creatinine of 5.4, creatinine improved to 3.7 today. Baseline creatinine appears to be around 3.6. Recheck renal parameters in am.  Probably sec to dehydration.    Hyperosmolar hyperglycemic crisis in the setting of poorly controlled insulin-dependent diabetes: A1c is 12.4.  CBG (last 3)  Recent Labs    04/17/20 2103 04/18/20 0757 04/18/20 1158  GLUCAP 261* 314* 313*    Increase Lantus to 28 units daily.  , change SSI to resistant scale.  Increase novolog 5 units TIDAC.    Onychomycosis of right great toenail.  Topical anti fungal agent.    Hypertension:  BP optimal.    Hyperlipidemia:  Resume crestor.    DVT prophylaxis; Heparin Code Status: (Full Code) Family Communication: none at bedside.  Disposition:   Status is: Observation  The patient will require care spanning > 2 midnights and should be moved to inpatient because: Inpatient level of care appropriate due to severity of illness, elevated cbg's  Dispo:  Patient From: Home  Planned Disposition: Home  Expected discharge date: 04/19/2020  Medically stable for discharge: No      Level of care: Med-Surg Consultants:   None.   Procedures: none.  Antimicrobials: none.   Subjective: No new complaints , but  worried about her high cbgs   Objective: Vitals:   04/17/20 0316 04/17/20 1407 04/17/20 1959 04/18/20 0342  BP: (!) 154/84 135/69 138/75 (!) 144/88  Pulse: 74 91 88 72  Resp: '15 18 15 15  '$ Temp: 97.7 F (36.5 C) 98.4 F (36.9 C) 98.3 F (36.8 C) 97.6 F (36.4 C)  TempSrc: Oral  Oral Oral  SpO2: 100% 100% 100% 100%  Weight:      Height:       No intake or output data in the 24 hours ending 04/18/20 1329 Filed Weights   04/15/20 2126  Weight: 64.9 kg    Examination:  General exam: Appears calm and comfortable  Respiratory system: Clear to auscultation. Respiratory effort normal. Cardiovascular system: S1 & S2 heard, RRR. No JVD,  No pedal edema. Gastrointestinal system: Abdomen is nondistended, soft and nontender. Normal bowel sounds heard. Central nervous system: Alert and oriented. No focal neurological deficits. Extremities: Symmetric 5 x 5 power. Skin: No rashes, lesions or ulcers Psychiatry:  Mood & affect appropriate.     Data Reviewed: I have personally reviewed following labs and imaging studies  CBC: Recent Labs  Lab 04/14/20 1247 04/15/20 2127  WBC 7.5 7.0  HGB 11.0* 11.3*  HCT 33.3* 35.5*  MCV 88.8 92.0  PLT 332 99991111    Basic Metabolic Panel: Recent Labs  Lab 04/14/20 1247 04/15/20 2127 04/16/20 0426 04/17/20 0353 04/18/20 0406  NA 121* 123* 132* 134* 137  K 4.7 4.4 3.1* 3.6 4.0  CL 80* 81* 94* 97* 99  CO2 27  $'25 26 26 25  'C$ GLUCOSE 705* 744* 266* 322* 330*  BUN 94* 103* 96* 89* 87*  CREATININE 4.72* 5.41* 4.73* 4.47* 3.77*  CALCIUM 8.7* 8.2* 8.1* 8.3* 9.1    GFR: Estimated Creatinine Clearance: 16.7 mL/min (A) (by C-G formula based on SCr of 3.77 mg/dL (H)).  Liver Function Tests: No results for input(s): AST, ALT, ALKPHOS, BILITOT, PROT, ALBUMIN in the last 168 hours.  CBG: Recent Labs  Lab 04/17/20 1109 04/17/20 1643 04/17/20 2103 04/18/20 0757 04/18/20 1158  GLUCAP 394* 180* 261* 314* 313*     Recent Results (from the  past 240 hour(s))  Resp Panel by RT-PCR (Flu A&B, Covid) Nasopharyngeal Swab     Status: None   Collection Time: 04/15/20 11:22 PM   Specimen: Nasopharyngeal Swab; Nasopharyngeal(NP) swabs in vial transport medium  Result Value Ref Range Status   SARS Coronavirus 2 by RT PCR NEGATIVE NEGATIVE Final    Comment: (NOTE) SARS-CoV-2 target nucleic acids are NOT DETECTED.  The SARS-CoV-2 RNA is generally detectable in upper respiratory specimens during the acute phase of infection. The lowest concentration of SARS-CoV-2 viral copies this assay can detect is 138 copies/mL. A negative result does not preclude SARS-Cov-2 infection and should not be used as the sole basis for treatment or other patient management decisions. A negative result may occur with  improper specimen collection/handling, submission of specimen other than nasopharyngeal swab, presence of viral mutation(s) within the areas targeted by this assay, and inadequate number of viral copies(<138 copies/mL). A negative result must be combined with clinical observations, patient history, and epidemiological information. The expected result is Negative.  Fact Sheet for Patients:  EntrepreneurPulse.com.au  Fact Sheet for Healthcare Providers:  IncredibleEmployment.be  This test is no t yet approved or cleared by the [place] FDA and  has been authorized for detection and/or diagnosis of SARS-CoV-2 by FDA under an Emergency Use Authorization (EUA). This EUA will remain  in effect (meaning this test can be used) for the duration of the COVID-19 declaration under Section 564(b)(1) of the Act, 21 U.S.C.section 360bbb-3(b)(1), unless the authorization is terminated  or revoked sooner.       Influenza A by PCR NEGATIVE NEGATIVE Final   Influenza B by PCR NEGATIVE NEGATIVE Final    Comment: (NOTE) The Xpert Xpress SARS-CoV-2/FLU/RSV plus assay is intended as an aid in the diagnosis of  influenza from Nasopharyngeal swab specimens and should not be used as a sole basis for treatment. Nasal washings and aspirates are unacceptable for Xpert Xpress SARS-CoV-2/FLU/RSV testing.  Fact Sheet for Patients: EntrepreneurPulse.com.au  Fact Sheet for Healthcare Providers: IncredibleEmployment.be  This test is not yet approved or cleared by the [place] FDA and has been authorized for detection and/or diagnosis of SARS-CoV-2 by FDA under an Emergency Use Authorization (EUA). This EUA will remain in effect (meaning this test can be used) for the duration of the COVID-19 declaration under Section 564(b)(1) of the Act, 21 U.S.C. section 360bbb-3(b)(1), unless the authorization is terminated or revoked.  Performed at [facility], [place] [address], [place], [place] [address]   Culture, Urine     Status: Abnormal   Collection Time: 04/16/20  4:26 AM   Specimen: Urine, Clean Catch  Result Value Ref Range Status   Specimen Description   Final    URINE, CLEAN CATCH Performed at [facility], [place] [address], [place], [place] [address]    Special Requests   Final    NONE Performed at  [facility], [place] [address], [place], [place] [address]    Culture (A)  Final    <10,000 COLONIES/mL INSIGNIFICANT GROWTH Performed at [place] [address], [place], [place] [address]    Report Status 04/17/2020 FINAL  Final         Radiology Studies: No results found.      Scheduled Meds: . aspirin EC  81 mg Oral QHS  . ciclopirox  1 application Topical BID  . heparin  5,000 Units Subcutaneous Q8H  . insulin aspart  0-20 Units Subcutaneous TID WC  . insulin aspart  0-5 Units Subcutaneous QHS  . insulin aspart  5 Units Subcutaneous TID WC  . insulin glargine  24 Units Subcutaneous Daily  . metoprolol succinate  50 mg Oral QHS  . NIFEdipine  90 mg Oral Daily  . rosuvastatin   20 mg Oral QPM   Continuous Infusions:   LOS: 0 days       Hosie Poisson, MD [facility]   To contact the attending provider between 7A-7P or the covering provider during after hours 7P-7A, please log into the web site [email or web address] and access using universal [place] password for that web site. If you do not have the password, please call the hospital operator.  04/18/2020, 1:29 PM   not

## 2020-04-18 NOTE — Progress Notes (Signed)
Inpatient Diabetes Program Recommendations  AACE/ADA: New Consensus Statement on Inpatient Glycemic Control (2015)  Target Ranges:  Prepandial:   less than 140 mg/dL      Peak postprandial:   less than 180 mg/dL (1-2 hours)      Critically ill patients:  140 - 180 mg/dL   Lab Results  Component Value Date   GLUCAP 261 (H) 04/17/2020   HGBA1C 12.4 (H) 04/16/2020    Review of Glycemic Control  Diabetes history: DM2 Outpatient Diabetes medications: Tresiba 7 units QHS, Fiasp 4 units TID with meals Current orders for Inpatient glycemic control: Lantus 20 units QD, Novolog 0-15 units TID with meals and 0-9 HS + 3 units TID with meals  HgbA1C - 12.4%  Inpatient Diabetes Program Recommendations:     Increase Lantus to 9 units BID Increase Novolog to 5 units TID with meals  Continue to follow trends.  Thank you. Lorenda Peck, RD, LDN, CDE Inpatient Diabetes Coordinator [phone]

## 2020-04-19 DIAGNOSIS — Z20822 Contact with and (suspected) exposure to covid-19: Secondary | ICD-10-CM | POA: Diagnosis present

## 2020-04-19 DIAGNOSIS — R739 Hyperglycemia, unspecified: Secondary | ICD-10-CM | POA: Diagnosis present

## 2020-04-19 DIAGNOSIS — D649 Anemia, unspecified: Secondary | ICD-10-CM | POA: Diagnosis present

## 2020-04-19 DIAGNOSIS — Z833 Family history of diabetes mellitus: Secondary | ICD-10-CM | POA: Diagnosis not present

## 2020-04-19 DIAGNOSIS — Z7982 Long term (current) use of aspirin: Secondary | ICD-10-CM | POA: Diagnosis not present

## 2020-04-19 DIAGNOSIS — E1122 Type 2 diabetes mellitus with diabetic chronic kidney disease: Secondary | ICD-10-CM | POA: Diagnosis present

## 2020-04-19 DIAGNOSIS — E785 Hyperlipidemia, unspecified: Secondary | ICD-10-CM

## 2020-04-19 DIAGNOSIS — R81 Glycosuria: Secondary | ICD-10-CM | POA: Diagnosis present

## 2020-04-19 DIAGNOSIS — Z8249 Family history of ischemic heart disease and other diseases of the circulatory system: Secondary | ICD-10-CM | POA: Diagnosis not present

## 2020-04-19 DIAGNOSIS — R8290 Unspecified abnormal findings in urine: Secondary | ICD-10-CM | POA: Diagnosis present

## 2020-04-19 DIAGNOSIS — Z881 Allergy status to other antibiotic agents status: Secondary | ICD-10-CM | POA: Diagnosis not present

## 2020-04-19 DIAGNOSIS — N184 Chronic kidney disease, stage 4 (severe): Secondary | ICD-10-CM | POA: Diagnosis present

## 2020-04-19 DIAGNOSIS — M19041 Primary osteoarthritis, right hand: Secondary | ICD-10-CM | POA: Diagnosis present

## 2020-04-19 DIAGNOSIS — E1165 Type 2 diabetes mellitus with hyperglycemia: Secondary | ICD-10-CM | POA: Diagnosis present

## 2020-04-19 DIAGNOSIS — N179 Acute kidney failure, unspecified: Secondary | ICD-10-CM | POA: Diagnosis present

## 2020-04-19 DIAGNOSIS — Z87442 Personal history of urinary calculi: Secondary | ICD-10-CM | POA: Diagnosis not present

## 2020-04-19 DIAGNOSIS — I129 Hypertensive chronic kidney disease with stage 1 through stage 4 chronic kidney disease, or unspecified chronic kidney disease: Secondary | ICD-10-CM | POA: Diagnosis present

## 2020-04-19 DIAGNOSIS — N132 Hydronephrosis with renal and ureteral calculous obstruction: Secondary | ICD-10-CM | POA: Diagnosis present

## 2020-04-19 DIAGNOSIS — E1100 Type 2 diabetes mellitus with hyperosmolarity without nonketotic hyperglycemic-hyperosmolar coma (NKHHC): Secondary | ICD-10-CM | POA: Diagnosis present

## 2020-04-19 DIAGNOSIS — F32A Depression, unspecified: Secondary | ICD-10-CM | POA: Diagnosis present

## 2020-04-19 DIAGNOSIS — Z794 Long term (current) use of insulin: Secondary | ICD-10-CM | POA: Diagnosis not present

## 2020-04-19 DIAGNOSIS — I10 Essential (primary) hypertension: Secondary | ICD-10-CM | POA: Diagnosis not present

## 2020-04-19 DIAGNOSIS — E860 Dehydration: Secondary | ICD-10-CM | POA: Diagnosis present

## 2020-04-19 DIAGNOSIS — Z8673 Personal history of transient ischemic attack (TIA), and cerebral infarction without residual deficits: Secondary | ICD-10-CM | POA: Diagnosis not present

## 2020-04-19 DIAGNOSIS — Z90710 Acquired absence of both cervix and uterus: Secondary | ICD-10-CM | POA: Diagnosis not present

## 2020-04-19 DIAGNOSIS — B351 Tinea unguium: Secondary | ICD-10-CM | POA: Diagnosis present

## 2020-04-19 LAB — GLUCOSE, CAPILLARY
Glucose-Capillary: 222 mg/dL — ABNORMAL HIGH (ref 70–99)
Glucose-Capillary: 269 mg/dL — ABNORMAL HIGH (ref 70–99)
Glucose-Capillary: 64 mg/dL — ABNORMAL LOW (ref 70–99)
Glucose-Capillary: 87 mg/dL (ref 70–99)
Glucose-Capillary: 270 mg/dL — ABNORMAL HIGH (ref 70–99)

## 2020-04-19 LAB — BASIC METABOLIC PANEL
Anion gap: 12 (ref 5–15)
BUN: 90 mg/dL — ABNORMAL HIGH (ref 6–20)
CO2: 27 mmol/L (ref 22–32)
Calcium: 9.1 mg/dL (ref 8.9–10.3)
Chloride: 98 mmol/L (ref 98–111)
Creatinine, Ser: 4.25 mg/dL — ABNORMAL HIGH (ref 0.44–1.00)
GFR, Estimated: 12 mL/min — ABNORMAL LOW (ref >60)
Glucose, Bld: 237 mg/dL — ABNORMAL HIGH (ref 70–99)
Potassium: 4.4 mmol/L (ref 3.5–5.1)
Sodium: 137 mmol/L (ref 135–145)

## 2020-04-19 MED ORDER — SODIUM CHLORIDE 0.9 % IV SOLN: INTRAVENOUS | Status: DC

## 2020-04-19 MED ORDER — INSULIN GLARGINE 100 UNIT/ML ~~LOC~~ SOLN
24.0000 [IU] | Freq: Every day | SUBCUTANEOUS | Status: DC
  Administered 2020-04-20: 24 [IU] via SUBCUTANEOUS
  Filled 2020-04-19: qty 0.24

## 2020-04-19 NOTE — TOC Initial Note (Signed)
Transition of Care [facility]) - Initial/Assessment Note    Patient Details  Name: Felicia Guerrero MRN: [id number] Date of Birth: January 08, 1971  Transition of Care [facility]) CM/SW Contact:    Trish Mage, LCSW Phone Number: 04/19/2020, 11:01 AM  Clinical Narrative:   CSW responded to patient request for [facility] paperwork.  Gave her my email address so she could forward it to me,and then will fill out and see if MD will sign off on time here. TOC will continue to follow during the course of hospitalization.                 Expected Discharge Plan: Home/Self Care Barriers to Discharge: No Barriers Identified   Patient Goals and CMS Choice        Expected Discharge Plan and Services Expected Discharge Plan: Home/Self Care                                              Prior Living Arrangements/Services                       Activities of Daily Living Home Assistive Devices/Equipment: Eyeglasses,CBG Meter ADL Screening (condition at time of admission) Patient's cognitive ability adequate to safely complete daily activities?: Yes Is the patient deaf or have difficulty hearing?: No Does the patient have difficulty seeing, even when wearing glasses/contacts?: No Does the patient have difficulty concentrating, remembering, or making decisions?: No Patient able to express need for assistance with ADLs?: Yes Does the patient have difficulty dressing or bathing?: No Independently performs ADLs?: Yes (appropriate for developmental age) Does the patient have difficulty walking or climbing stairs?: No Weakness of Legs: None Weakness of Arms/Hands: None  Permission Sought/Granted                  Emotional Assessment              Admission diagnosis:  Hyperglycemia [R73.9] AKI (acute kidney injury) ([place]) [N17.9] Hyperglycemic crisis in diabetes mellitus ([place]) [E11.65] Patient Active Problem List   Diagnosis Date Noted  . Hyperglycemia 04/16/2020  . AKI  (acute kidney injury) ([place]) 04/16/2020  . Onychomycosis 04/16/2020  . HTN (hypertension) 04/16/2020  . HLD (hyperlipidemia) 04/16/2020  . Hyperglycemic crisis in diabetes mellitus ([place]) 04/16/2020  . Pyelonephritis 02/17/2017  . Sepsis ([place]) 02/17/2017  . Acute renal insufficiency 02/17/2017  . Left ureteral stone 02/15/2017   PCP:  Bartholome Bill, MD Pharmacy:   [id number] [place] [address]Phone: [phone]: [phone]    Social Determinants of Health (SDOH) Interventions    Readmission Risk Interventions No flowsheet data found.

## 2020-04-19 NOTE — Progress Notes (Signed)
PROGRESS NOTE    Felicia Guerrero  [id number] DOB: 07-01-1970 DOA: 04/15/2020 PCP: Bartholome Bill, MD    Chief Complaint  Patient presents with  . Hyperglycemia    Brief Narrative: 50 year old lady prior history of stage IV CKD, hypertension, hyperlipidemia, insulin-dependent diabetes, CVA presents to ED with hyperglycemia and AKI.  She follows up with Dr. Moshe Cipro as outpatient.  CBG's are improving. Pt seen and examined. Her creatinine worsened to 4.25, nephrology consulted for recommendations.    Assessment & Plan:   Principal Problem:   Hyperglycemia Active Problems:   AKI (acute kidney injury) ([place])   Onychomycosis   HTN (hypertension)   HLD (hyperlipidemia)   Hyperglycemic crisis in diabetes mellitus ([place])   AKI on stage 4 CKD.  US renal shows Mild right hydronephrosis though visualization of the right bladder jet argues against a complete obstruction. Multiple bilateral shadowing calculi measuring up to 4.6 mm on the right and 9.9 mm on the left. Increased renal cortical echogenicity bilaterally, nonspecific but can be seen in the setting of medical renal disease. Admitted with a creatinine of 5.4>4.72>4.47>3.7<4.25 . Nephrology consulted.  Baseline creatinine appears to be around 3.6. Recheck renal parameters in am.  Probably sec to dehydration continue with fluids. Recommend outpatient follow-up with call Dr. Moshe Cipro in 1 week on discharge  Hyperosmolar hyperglycemic crisis in the setting of poorly controlled insulin-dependent diabetes: A1c is 12.4.  CBG (last 3)  Recent Labs    04/18/20 1702 04/18/20 2132 04/19/20 0740  GLUCAP 78 296* 222*    Increase Lantus to 24 units daily.  , change SSI to resistant scale.     Onychomycosis of right great toenail.  Topical anti fungal agent.    Hypertension:  Well controlled.    Hyperlipidemia:  Resume crestor.    Mild normocytic anemia Hemoglobin stable around 11.   DVT  prophylaxis; Heparin Code Status: (Full Code) Family Communication: none at bedside.  Disposition:   Status is: Observation  The patient will require care spanning > 2 midnights and should be moved to inpatient because: Inpatient level of care appropriate due to severity of illness, elevated cbg's, Aki.   Dispo:  Patient From: Home  Planned Disposition: Home  Expected discharge date: 04/20/2020  Medically stable for discharge: No      Level of care: Med-Surg Consultants:   Nephrology.  Procedures: none.   Antimicrobials: none.   Subjective: Chest pain, shortness of breath nausea or vomiting. Objective: Vitals:   04/17/20 1959 04/18/20 0342 04/18/20 2130 04/19/20 0412  BP: 138/75 (!) 144/88 129/75 (!) 151/83  Pulse: 88 72 78 81  Resp: '15 15 17 18  '$ Temp: 98.3 F (36.8 C) 97.6 F (36.4 C) 98.6 F (37 C) 98 F (36.7 C)  TempSrc: Oral Oral    SpO2: 100% 100% 100% 100%  Weight:      Height:       No intake or output data in the 24 hours ending 04/19/20 1114 Filed Weights   04/15/20 2126  Weight: 64.9 kg    Examination:  General exam: Alert and comfortable, not in any kind of distress Respiratory system: Clear to auscultation bilaterally, no wheezing or rhonchi Cardiovascular system: S1-S2 heard, no JVD no pedal edema Gastrointestinal system: Abdomen soft, nontender bowel sounds normal Central nervous system: Alert and oriented, grossly nonfocal Extremities: No pedal edema or cyanosis Skin: No rashes seen Psychiatry: Mood is appropriate    Data Reviewed: I have personally reviewed following labs and imaging studies  CBC:  Recent Labs  Lab 04/14/20 1247 04/15/20 2127  WBC 7.5 7.0  HGB 11.0* 11.3*  HCT 33.3* 35.5*  MCV 88.8 92.0  PLT 332 [id number]    Basic Metabolic Panel: Recent Labs  Lab 04/15/20 2127 04/16/20 0426 04/17/20 0353 04/18/20 0406 04/19/20 0402  NA 123* 132* 134* 137 137  K 4.4 3.1* 3.6 4.0 4.4  CL 81* 94* 97* 99 98  CO2 '25 26 26 25  27  '$ GLUCOSE 744* 266* 322* 330* 237*  BUN 103* 96* 89* 87* 90*  CREATININE 5.41* 4.73* 4.47* 3.77* 4.25*  CALCIUM 8.2* 8.1* 8.3* 9.1 9.1    GFR: Estimated Creatinine Clearance: 14.8 mL/min (A) (by C-G formula based on SCr of 4.25 mg/dL (H)).  Liver Function Tests: No results for input(s): AST, ALT, ALKPHOS, BILITOT, PROT, ALBUMIN in the last 168 hours.  CBG: Recent Labs  Lab 04/18/20 0757 04/18/20 1158 04/18/20 1702 04/18/20 2132 04/19/20 0740  GLUCAP 314* 313* 78 296* 222*     Recent Results (from the past 240 hour(s))  Resp Panel by RT-PCR (Flu A&B, Covid) Nasopharyngeal Swab     Status: None   Collection Time: 04/15/20 11:22 PM   Specimen: Nasopharyngeal Swab; Nasopharyngeal(NP) swabs in vial transport medium  Result Value Ref Range Status   SARS Coronavirus 2 by RT PCR NEGATIVE NEGATIVE Final    Comment: (NOTE) SARS-CoV-2 target nucleic acids are NOT DETECTED.  The SARS-CoV-2 RNA is generally detectable in upper respiratory specimens during the acute phase of infection. The lowest concentration of SARS-CoV-2 viral copies this assay can detect is 138 copies/mL. A negative result does not preclude SARS-Cov-2 infection and should not be used as the sole basis for treatment or other patient management decisions. A negative result may occur with  improper specimen collection/handling, submission of specimen other than nasopharyngeal swab, presence of viral mutation(s) within the areas targeted by this assay, and inadequate number of viral copies(<138 copies/mL). A negative result must be combined with clinical observations, patient history, and epidemiological information. The expected result is Negative.  Fact Sheet for Patients:  EntrepreneurPulse.com.au  Fact Sheet for Healthcare Providers:  IncredibleEmployment.be  This test is no t yet approved or cleared by the [place] FDA and  has been authorized for detection and/or  diagnosis of SARS-CoV-2 by FDA under an Emergency Use Authorization (EUA). This EUA will remain  in effect (meaning this test can be used) for the duration of the COVID-19 declaration under Section 564(b)(1) of the Act, 21 U.S.C.section 360bbb-3(b)(1), unless the authorization is terminated  or revoked sooner.       Influenza A by PCR NEGATIVE NEGATIVE Final   Influenza B by PCR NEGATIVE NEGATIVE Final    Comment: (NOTE) The Xpert Xpress SARS-CoV-2/FLU/RSV plus assay is intended as an aid in the diagnosis of influenza from Nasopharyngeal swab specimens and should not be used as a sole basis for treatment. Nasal washings and aspirates are unacceptable for Xpert Xpress SARS-CoV-2/FLU/RSV testing.  Fact Sheet for Patients: EntrepreneurPulse.com.au  Fact Sheet for Healthcare Providers: IncredibleEmployment.be  This test is not yet approved or cleared by the [place] FDA and has been authorized for detection and/or diagnosis of SARS-CoV-2 by FDA under an Emergency Use Authorization (EUA). This EUA will remain in effect (meaning this test can be used) for the duration of the COVID-19 declaration under Section 564(b)(1) of the Act, 21 U.S.C. section 360bbb-3(b)(1), unless the authorization is terminated or revoked.  Performed at [facility], [address] [place],  [place] [address]   Culture, Urine     Status: Abnormal   Collection Time: 04/16/20  4:26 AM   Specimen: Urine, Clean Catch  Result Value Ref Range Status   Specimen Description   Final    URINE, CLEAN CATCH Performed at [facility], [place] [address], [place], [place] [address]    Special Requests   Final    NONE Performed at [facility], [place] [address] [place], [place] [address]    Culture (A)  Final    <10,000 COLONIES/mL INSIGNIFICANT GROWTH Performed at [facility] [address] [place], [place]  [address]    Report Status 04/17/2020 FINAL  Final         Radiology Studies: No results found.      Scheduled Meds: . aspirin EC  81 mg Oral QHS  . ciclopirox  1 application Topical BID  . heparin  5,000 Units Subcutaneous Q8H  . insulin aspart  0-20 Units Subcutaneous TID WC  . insulin aspart  0-5 Units Subcutaneous QHS  . insulin aspart  3 Units Subcutaneous TID WC  . [START ON 04/20/2020] insulin glargine  24 Units Subcutaneous Daily  . metoprolol succinate  50 mg Oral QHS  . NIFEdipine  90 mg Oral Daily  . rosuvastatin  20 mg Oral QPM   Continuous Infusions: . sodium chloride 75 mL/hr at 04/19/20 1102     LOS: 0 days       Hosie Poisson, MD [facility]   To contact the attending provider between 7A-7P or the covering provider during after hours 7P-7A, please log into the web site [email or web address] and access using universal [place] password for that web site. If you do not have the password, please call the hospital operator.  04/19/2020, 11:14 AM   not

## 2020-04-19 NOTE — Consult Note (Signed)
Renal Service Consult Note [facility]  [address] Felicia Guerrero 04/19/2020 Felicia Blazing, MD Requesting Physician: Dr. Karleen Hampshire  Reason for Consult: Renal failure HPI: The patient is a 50 y.o. year-old w/ hx of DM2, CKD IV, HTN, HL, hx CVA presented on 2/20 to ED w/ hyperglycemia after being changed to a new insulin regimen and missed some doses due to insurance issues. Creat was 4.7 on admission and went down to 3.7 yest and up to 4.25 today. Asked to see for renal failure.   Pt seen in room, no n/v/d, no abd pain, voiding issues, no SOB or cough or leg edema.      Date   Creat  eGFR  2018   1.89- 2.64  Jan 2019  1.35  Dec 2020  4.77  jan -jun 2021  3.53- 4.35  jul - dec 2021  3.75- 5.02 Mar 2020  3.64  feb 19- 24, 2022 3.77- 5.41    ROS  denies CP  no joint pain   no HA  no blurry vision  no rash  no diarrhea  no nausea/ vomiting  no dysuria  no difficulty voiding  no change in urine color    Past Medical History  Past Medical History:  Diagnosis Date  . Anemia    hx of  . Arthritis    right pinky osteoarthritis  . Chronic kidney disease    stage 3 ckd  . Depression    hx of  . Diabetes mellitus without complication ([place])    type 2  . Heart murmur   . History of kidney stones   . Hypertension   . Pneumothorax   . Stroke [facility])    short term memory loss and left side weakness /blindness right eye   Nov 28 2014   Past Surgical History  Past Surgical History:  Procedure Laterality Date  . CARDIAC SURGERY     For pericardial effusion  . CESAREAN SECTION    . CHEST TUBE INSERTION    . CYSTOSCOPY W/ URETERAL STENT PLACEMENT Left 02/15/2017   Procedure: CYSTOSCOPY WITH LEFT URETERAL STENT PLACEMENT;  Surgeon: Raynelle Bring, MD;  Location: [facility];  Service: Urology;  Laterality: Left;  . CYSTOSCOPY/URETEROSCOPY/HOLMIUM LASER/STENT PLACEMENT Left 03/12/2017   Procedure: CYSTOSCOPY/URETEROSCOPY/HOLMIUM LASER/STENT PLACEMENT;  Surgeon: Raynelle Bring, MD;  Location: [facility];  Service: Urology;  Laterality: Left;  . CYSTOSCOPY/URETEROSCOPY/HOLMIUM LASER/STENT PLACEMENT Left 11/28/2019   Procedure: CYSTOSCOPY/RETROGRADE/URETEROSCOPY;  Surgeon: Raynelle Bring, MD;  Location: [facility];  Service: Urology;  Laterality: Left;  . EYE SURGERY     right eye  . HYSTERECTOMY ABDOMINAL WITH SALPINGECTOMY    . KIDNEY STONE SURGERY    . PERICARDIAL WINDOW    . WISDOM TOOTH EXTRACTION     Family History  Family History  Problem Relation Age of Onset  . Diabetes Mother   . Congestive Heart Failure Mother   . Prostate cancer Father   . Diabetes Sister   . High blood pressure Brother    Social History  reports that she has never smoked. She has never used smokeless tobacco. She reports that she does not drink alcohol and does not use drugs. Allergies  Allergies  Allergen Reactions  . Cephalexin Diarrhea   Home medications Prior to Admission medications   Medication Sig Start Date End Date Taking? Authorizing Provider  acetaminophen (TYLENOL) 500 MG tablet Take 500 mg by mouth every 6 (six) hours as needed for moderate pain.   Yes [provider]  aspirin 81 MG EC tablet Take 81 mg by mouth at bedtime.    Yes [provider]  chlorthalidone (HYGROTON) 25 MG tablet Take 25 mg by mouth daily.   Yes [provider]  ciclopirox (PENLAC) 8 % solution Apply 1 application topically 2 (two) times daily. 04/10/20  Yes [provider]  clotrimazole-betamethasone (LOTRISONE) cream Apply 1 application topically 2 (two) times daily. 04/10/20  Yes [provider]  doxycycline (VIBRAMYCIN) 100 MG capsule Take 1 capsule (100 mg total) by mouth 2 (two) times daily for 7 days. 04/14/20 04/21/20 Yes Garald Balding, PA-C  epoetin alfa (EPOGEN) 2000 UNIT/ML injection Inject 2,000 Units into the skin every 28 (twenty-eight) days.   Yes [provider]  Insulin Aspart, w/Niacinamide, (FIASP PENFILL) 100 UNIT/ML SOCT  Inject 4 Units into the skin See admin instructions. Before each meal 04/03/20  Yes [provider]  Insulin Degludec (TRESIBA) 100 UNIT/ML SOLN Inject 7 Units into the skin at bedtime.   Yes [provider]  lisinopril (ZESTRIL) 20 MG tablet Take 20 mg by mouth daily.    Yes [provider]  Magnesium Oxide 420 MG TABS Take 420 mg by mouth daily.   Yes [provider]  metoprolol succinate (TOPROL-XL) 50 MG 24 hr tablet Take 50 mg by mouth at bedtime. 09/09/19  Yes [provider]  Multiple Vitamin (MULTIVITAMIN WITH MINERALS) TABS tablet Take 1 tablet by mouth daily.   Yes [provider]  NIFEdipine (ADALAT CC) 90 MG 24 hr tablet Take 90 mg by mouth daily. 11/19/19  Yes [provider]  rosuvastatin (CRESTOR) 20 MG tablet Take 20 mg by mouth every evening. 05/27/16  Yes [provider]     Vitals:   04/17/20 1959 04/18/20 0342 04/18/20 2130 04/19/20 0412  BP: 138/75 (!) 144/88 129/75 (!) 151/83  Pulse: 88 72 78 81  Resp: 15 15 17 18   Temp: 98.3 F (36.8 C) 97.6 F (36.4 C) 98.6 F (37 C) 98 F (36.7 C)  TempSrc: Oral Oral    SpO2: 100% 100% 100% 100%  Weight:      Height:       Exam  alert, nad   no jvd  Chest cta bilat  Cor reg no RG  Abd soft ntnd no ascites   Ext no LE edema   Alert, NF, ox3       Date   Creat  eGFR  2018   1.89- 2.64  Jan 2019  1.35  Dec 2020  4.77  jan -jun 2021  3.53- 4.35 13- 17 ml/min  jul - dec 2021  3.75- 5.07 11- 15 ml/min  jan 2022  3.64  15  feb 19- 24, 2022 3.77- 5.41 9 - 14 ml/in     Home meds:  - asa 81/ hygroton 25/ crestor 20/ nifedipine 90 qd/ toprol xl 50 qd/ zestril 20   - insulin aspart 4u ac tid/ tresiba 7u hs sq      Assessment/ Plan: 1. CKD stage V - pt's renal function is stable compared to recent baseline in the past year. Baseline creat is 3.5- 4.3, eGFR 13- 17. No uremic symptoms.  Creat fluctuations in house are probably due to hydration (more or  less).  OK for dc from renal standpoint. Have d/w pt and pmd. She will f/u w/ Dr Moshe Cipro. Will sign off.  2. Uncont DM2 - BS 700's on admission, improved now.  3. HTN - BP's are wnl, pt is  euvolemic. Can continue usual BP meds at d/c except continue to hold lisinopril for another 3-5 days then resume.         Rob Candid Bovey  MD 04/19/2020, 1:25 PM  Recent Labs  Lab 04/14/20 1247 04/15/20 2127  WBC 7.5 7.0  HGB 11.0* 11.3*   Recent Labs  Lab 04/18/20 0406 04/19/20 0402  K 4.0 4.4  BUN 87* 90*  CREATININE 3.77* 4.25*  CALCIUM 9.1 9.1

## 2020-04-20 LAB — BASIC METABOLIC PANEL
Anion gap: 12 (ref 5–15)
BUN: 80 mg/dL — ABNORMAL HIGH (ref 6–20)
CO2: 24 mmol/L (ref 22–32)
Calcium: 8.7 mg/dL — ABNORMAL LOW (ref 8.9–10.3)
Chloride: 98 mmol/L (ref 98–111)
Creatinine, Ser: 3.7 mg/dL — ABNORMAL HIGH (ref 0.44–1.00)
GFR, Estimated: 14 mL/min — ABNORMAL LOW (ref >60)
Glucose, Bld: 135 mg/dL — ABNORMAL HIGH (ref 70–99)
Potassium: 4.4 mmol/L (ref 3.5–5.1)
Sodium: 134 mmol/L — ABNORMAL LOW (ref 135–145)

## 2020-04-20 LAB — GLUCOSE, CAPILLARY
Glucose-Capillary: 164 mg/dL — ABNORMAL HIGH (ref 70–99)
Glucose-Capillary: 203 mg/dL — ABNORMAL HIGH (ref 70–99)

## 2020-04-20 MED ORDER — FIASP PENFILL 100 UNIT/ML ~~LOC~~ SOCT: 3.0000 [IU] | SUBCUTANEOUS | Status: AC

## 2020-04-20 MED ORDER — TRESIBA 100 UNIT/ML ~~LOC~~ SOLN: 22.0000 [IU] | Freq: Every day | SUBCUTANEOUS | Status: AC

## 2020-04-20 NOTE — Discharge Instructions (Signed)
Hyperglycemia Hyperglycemia occurs when the level of sugar (glucose) in the blood is too high. Glucose is a type of sugar that provides the body's main source of energy. Certain hormones (insulin and glucagon) control the level of glucose in the blood. Insulin lowers blood glucose, and glucagon increases blood glucose. Hyperglycemia can result from not having enough insulin in the bloodstream, or from the body not responding normally to insulin. Hyperglycemia occurs most often in people who have diabetes (diabetes mellitus), but it can happen in people who do not have diabetes. It can develop quickly, and it can be life-threatening if it causes you to become severely dehydrated (diabetic ketoacidosis or hyperglycemic hyperosmolar state). Severe hyperglycemia is a medical emergency. For most people with diabetes, a blood glucose level above 240 mg/dL is considered hyperglycemia. What are the causes? If you have diabetes, hyperglycemia may be caused by:  Medicines that increase blood glucose or affect your diabetes control.  Getting less physical activity.  Eating more than planned.  Being sick or injured, having an infection, or having surgery.  Stress.  Not giving yourself enough insulin (if you are taking insulin). If you have undiagnosed diabetes, this may be the reason you have hyperglycemia. If you do not have diabetes, hyperglycemia may be caused by:  Certain medicines, including: ? Steroid medicines. ? Beta-blockers. ? Epinephrine. ? Thiazide diuretics.  Stress.  Having a serious illness, an infection, or surgery.  Diseases of the pancreas. What increases the risk? Hyperglycemia is more likely to develop in people who have risk factors for diabetes, such as:  Having a family member with diabetes.  Certain conditions in which the body's disease-fighting system (immune system) attacks itself (autoimmune disorders).  Being overweight or obese.  Having an inactive  (sedentary) lifestyle.  Having been diagnosed with insulin resistance.  Having a history of prediabetes, gestational diabetes, or polycystic ovarian syndrome (PCOS). What are the signs or symptoms? Hyperglycemia may not cause any symptoms. If you do have symptoms, they may include:  Increased thirst.  Needing to urinate more often than usual.  Hunger.  Feeling very tired.  Blurry vision. Other symptoms may develop if hyperglycemia gets worse, such as:  Dry mouth.  Abdominal pain.  Loss of appetite.  Fruity-smelling breath.  Weakness.  Unexpected weight loss.  Tingling or numbness in the hands or feet.  Headache.  Cuts or bruises that are slow to heal. How is this diagnosed? Hyperglycemia is diagnosed with a blood test to measure your blood glucose level. This blood test is usually done while you are having symptoms. Your health care provider may also do a physical exam and review your medical history. You may have more tests to determine the cause of your hyperglycemia, such as:  A fasting blood glucose (FBG) test. You will not be allowed to eat (you will fast) for at least 8 hours before a blood sample is taken.  An A1C blood test. This provides information about blood glucose control over the previous 2-3 months.  An oral glucose tolerance test (OGTT). This measures your blood glucose at two times: ? After fasting. This is your baseline blood glucose level. ? 2 hours after drinking a beverage that contains glucose. How is this treated? Treatment depends on the cause of your hyperglycemia. Treatment may include:  Taking medicine to regulate your blood glucose levels. If you take insulin or other diabetes medicines, your medicine or dosage may be adjusted.  Lifestyle changes, such as exercising more, eating healthier foods, or losing  weight.  Treating an illness or infection.  Checking your blood glucose more often.  Stopping or reducing steroid  medicines. If your hyperglycemia becomes severe and it results in diabetic ketoacidosis or hyperglycemic hyperosmolar state, you must be hospitalized and given IV fluids and IV insulin. Follow these instructions at home: General instructions  Take over-the-counter and prescription medicines only as told by your health care provider.  Do not use any products that contain nicotine or tobacco. These products include cigarettes, chewing tobacco, and vaping devices, such as e-cigarettes. If you need help quitting, ask your health care provider.  If you drink alcohol: ? Limit how much you have to:  0-1 drink a day for women who are not pregnant.  0-2 drinks a day for men. ? Know how much alcohol is in a drink. In the [place] one drink equals one 12 oz bottle of beer (355 mL), one 5 oz glass of wine (148 mL), or one 1 oz glass of hard liquor (44 mL).  Learn to manage stress. If you need help with this, ask your health care provider.  Do exercises as told by your health care provider.  Keep all follow-up visits. This is important. Eating and drinking  Maintain a healthy weight.  Stay hydrated, especially when you exercise, get sick, or spend time in hot temperatures.  Drink enough fluid to keep your urine pale yellow.   If you have diabetes:  Know the symptoms of hyperglycemia.  Follow your diabetes management plan as told by your health care provider. Make sure you: ? Take your insulin and medicines as told. ? Follow your exercise plan. ? Follow your meal plan. Eat on time, and do not skip meals. ? Check your blood glucose as often as told. Make sure to check your blood glucose before and after exercise. If you exercise longer or in a different way, check your blood glucose more often. ? Follow your sick day plan whenever you cannot eat or drink normally. Make this plan in advance with your health care provider.  Share your diabetes management plan with people in your workplace,  school, and household.  Check your urine for ketones when you are ill and as told by your health care provider.  Carry a medical alert card or wear medical alert jewelry.   Where to find more information American Diabetes Association: www.diabetes.org Contact a health care provider if:  Your blood glucose is at or above 240 mg/dL (13.3 mmol/L) for 2 days in a row.  You have problems keeping your blood glucose in your target range.  You have frequent episodes of hyperglycemia.  You have signs of illness, such as nausea, vomiting, or fever. Get help right away if:  Your blood glucose monitor reads "high" even when you are taking insulin.  You have trouble breathing.  You have a change in how you think, feel, or act (mental status).  You have nausea or vomiting that does not go away. These symptoms may represent a serious problem that is an emergency. Do not wait to see if the symptoms will go away. Get medical help right away. Call your local emergency services (911 in the [place] Do not drive yourself to the hospital. Summary  Hyperglycemia occurs when the level of sugar (glucose) in the blood is too high.  Hyperglycemia can happen with or without diabetes, and severe hyperglycemia can be life-threatening.  Hyperglycemia is diagnosed with a blood test to measure your blood glucose level. This blood test  is usually done while you are having symptoms. Your health care provider may also do a physical exam and review your medical history.  If you have diabetes, follow your diabetes management plan as told by your health care provider.  Contact your health care provider if you have problems keeping your blood glucose in your target range. This information is not intended to replace advice given to you by your health care provider. Make sure you discuss any questions you have with your health care provider. Document Revised: 11/25/2019 Document Reviewed: 11/25/2019 Elsevier Patient  Education  2021 [employer].

## 2020-04-20 NOTE — Plan of Care (Signed)

## 2020-04-20 NOTE — Progress Notes (Signed)
Inpatient Diabetes Program Recommendations  AACE/ADA: New Consensus Statement on Inpatient Glycemic Control (2015)  Target Ranges:  Prepandial:   less than 140 mg/dL      Peak postprandial:   less than 180 mg/dL (1-2 hours)      Critically ill patients:  140 - 180 mg/dL   Lab Results  Component Value Date   GLUCAP 164 (H) 04/20/2020   HGBA1C 12.4 (H) 04/16/2020    Review of Glycemic Control  Diabetes history: DM2 Outpatient Diabetes medications: Tresiba 7 units QHS Current orders for Inpatient glycemic control: Lantus 24 units QD, Novolog 0-20 units TID with meals and 0-5 HS + 3 units TID with meals  Inpatient Diabetes Program Recommendations:     For discharge:  Tresiba 22 units QHS Fiasp 3 units TID with meals  Thank you. Lorenda Peck, RD, LDN, CDE Inpatient Diabetes Coordinator [phone]

## 2020-04-20 NOTE — Progress Notes (Signed)
Nutrition Note  Patient requesting education regarding diabetes diet management.  Lab Results  Component Value Date   HGBA1C 12.4 (H) 04/16/2020    RD provided "Carbohydrate Counting for People with Diabetes" handout from the Academy of Nutrition and Dietetics. Discussed different food groups and their effects on blood sugar, emphasizing carbohydrate-containing foods. Provided list of carbohydrates and recommended serving sizes of common foods.  Discussed importance of controlled and consistent carbohydrate intake throughout the day. Provided examples of ways to balance meals/snacks and encouraged intake of high-fiber, whole grain complex carbohydrates. Teach back method used.  Expect good compliance. Pt motivated to make changes in her diet to help overall blood sugar control.  Requested handouts on high iron foods and snacking options. Provided these handouts as well.  Body mass index is 23.08 kg/m. Pt meets criteria for normal based on current BMI.  Current diet order is HH/CHO modified, patient is consuming approximately 100% of meals at this time. Labs and medications reviewed. No further nutrition interventions warranted at this time.  If additional nutrition issues arise, please re-consult RD.  Clayton Bibles, MS, RD, LDN Inpatient Clinical Dietitian Contact information available via Amion

## 2020-04-20 NOTE — Discharge Summary (Signed)
Physician Discharge Summary  Felicia Guerrero [id number] DOB: 1970-10-14 DOA: 04/15/2020  PCP: Bartholome Bill, MD  Admit date: 04/15/2020 Discharge date: 04/20/2020  Admitted From: Home.  Disposition: Home.   Recommendations for Outpatient Follow-up:  1. Follow up with PCP in 1-2 weeks 2. Please obtain BMP/CBC in one week 3. Please follow up with endocrinology in one week.     Discharge Condition: stable.  CODE STATUS:full code.  Diet recommendation: Heart Healthy / Carb Modified    Brief/Interim Summary:  50 year old lady prior history of stage IV CKD, hypertension, hyperlipidemia, insulin-dependent diabetes, CVA presents to ED with hyperglycemia and AKI.  She follows up with Dr. Moshe Cipro as outpatient.  CBG's are improving. Pt seen and examined. Her creatinine worsened to 4.25, nephrology consulted for recommendations.    Discharge Diagnoses:  Principal Problem:   Hyperglycemia Active Problems:   AKI (acute kidney injury) ([place])   Onychomycosis   HTN (hypertension)   HLD (hyperlipidemia)   Hyperglycemic crisis in diabetes mellitus ([place])   AKI on stage 4 CKD.  US renal shows Mild right hydronephrosis though visualization of the right bladder jet argues against a complete obstruction. Multiple bilateral shadowing calculi measuring up to 4.6 mm on the right and 9.9 mm on the left. Increased renal cortical echogenicity bilaterally, nonspecific but can be seen in the setting of medical renal disease. Admitted with a creatinine of 5.4>4.72>4.47>3.7<4.25 . Nephrology consulted, suggested that patient's creatinine fluctuates between 3 to 4 . No further work up needed at this time.  Baseline creatinine appears to be around 3.6. Recommend outpatient follow-up with call Dr. Moshe Cipro in 1 week on discharge  Hyperosmolar hyperglycemic crisis in the setting of poorly controlled insulin-dependent diabetes: A1c is 12.4.  CBG (last 3)  Recent Labs     04/19/20 2132 04/20/20 0732 04/20/20 1138  GLUCAP 270* 164* 203*   discharged on 22 units of lantus and novolog tidac . Recommend outpatient follow up with endocrinology.     Onychomycosis of right great toenail.  Topical anti fungal agent.    Hypertension:  Well controlled.    Hyperlipidemia:  Resume crestor.    Mild normocytic anemia Hemoglobin stable around 11.     Discharge Instructions  Discharge Instructions    Diet - low sodium heart healthy   Complete by: As directed    Increase activity slowly   Complete by: As directed      Allergies as of 04/20/2020      Reactions   Cephalexin Diarrhea      Medication List    STOP taking these medications   doxycycline 100 MG capsule Commonly known as: VIBRAMYCIN   lisinopril 20 MG tablet Commonly known as: ZESTRIL     TAKE these medications   acetaminophen 500 MG tablet Commonly known as: TYLENOL Take 500 mg by mouth every 6 (six) hours as needed for moderate pain.   aspirin 81 MG EC tablet Take 81 mg by mouth at bedtime.   chlorthalidone 25 MG tablet Commonly known as: HYGROTON Take 25 mg by mouth daily.   ciclopirox 8 % solution Commonly known as: PENLAC Apply 1 application topically 2 (two) times daily.   clotrimazole-betamethasone cream Commonly known as: LOTRISONE Apply 1 application topically 2 (two) times daily.   epoetin alfa 2000 UNIT/ML injection Commonly known as: EPOGEN Inject 2,000 Units into the skin every 28 (twenty-eight) days.   Fiasp PenFill 100 UNIT/ML Soct Generic drug: Insulin Aspart (w/Niacinamide) Inject 3 Units into the skin See admin instructions.  Before each meal What changed: how much to take   Magnesium Oxide 420 MG Tabs Take 420 mg by mouth daily.   metoprolol succinate 50 MG 24 hr tablet Commonly known as: TOPROL-XL Take 50 mg by mouth at bedtime.   multivitamin with minerals Tabs tablet Take 1 tablet by mouth daily.   NIFEdipine 90 MG 24 hr  tablet Commonly known as: ADALAT CC Take 90 mg by mouth daily.   rosuvastatin 20 MG tablet Commonly known as: CRESTOR Take 20 mg by mouth every evening.   Tresiba 100 UNIT/ML Soln Generic drug: Insulin Degludec Inject 22 Units into the skin at bedtime. What changed: how much to take       [facility] Follow up on 04/26/2020.   Why: Thursday at 1:40 with Iline Oven.  Please call to reschedule if this is not convenient for you Contact information: [address], [place], [place] [address] Phone: [phone]       Bartholome Bill, MD. Schedule an appointment as soon as possible for a visit in 1 week(s).   Specialty: Family Medicine Contact information: [place] [phone]              Allergies  Allergen Reactions   Cephalexin Diarrhea    Consultations:  Nephrology  DM education/ co ordinator.    Procedures/Studies: US RENAL  Result Date: 04/16/2020 CLINICAL DATA:  Acute kidney injury, none urolithiasis, prior cystoscopy and stent placement EXAM: RENAL / URINARY TRACT ULTRASOUND COMPLETE COMPARISON:  CT 11/17/2019 FINDINGS: Right Kidney: Renal measurements: 11.4 x 4.9 x 5.1 cm = volume: 150.9 mL. Diffusely increased renal cortical echogenicity. Mild hydronephrosis. Multiple echogenic, shadowing calculi measuring up to 4.6 mm in diameter seen within the interpolar and lower pole right kidney. No concerning renal mass. Left Kidney: Renal measurements: 10.3 x 5.4 x 6.3 cm = volume: 182 mL. Diffusely increased renal cortical echogenicity. No hydronephrosis or concerning renal mass. Multiple shadowing calculi, largest in the lower pole measuring up to 9.9 mm. Bladder: Bilateral bladder jets identified. Urinary bladder unremarkable for degree of distension. Other: None. IMPRESSION: 1. Mild right hydronephrosis though visualization of the right bladder jet argues  against a complete obstruction. 2. Multiple bilateral shadowing calculi measuring up to 4.6 mm on the right and 9.9 mm on the left. 3. Increased renal cortical echogenicity bilaterally, nonspecific but can be seen in the setting of medical renal disease. Electronically Signed   By: Lovena Le M.D.   On: 04/16/2020 06:00   DG Toe Great Right  Result Date: 04/14/2020 CLINICAL DATA:  Right toe pain. EXAM: RIGHT GREAT TOE COMPARISON:  None. FINDINGS: There is no evidence of fracture or dislocation. There is no evidence of arthropathy or other focal bone abnormality. Soft tissues are unremarkable. IMPRESSION: Negative. Electronically Signed   By: Dorise Bullion III M.D   On: 04/14/2020 17:28   PCV ECHOCARDIOGRAM COMPLETE  Result Date: 04/15/2020 Echocardiogram 04/11/2020: Normal LV systolic function with visual EF 55-60%. Left ventricle cavity is normal in size. Mild left ventricular hypertrophy. Normal global wall motion. Normal diastolic filling pattern, normal LAP. Mild (Grade I) mitral regurgitation. Mild to moderate tricuspid regurgitation. No evidence of pulmonary hypertension. No prior study for comparison.  PCV CAROTID DUPLEX (BILATERAL)  Result Date: 04/15/2020 Carotid artery duplex 04/11/2020: No hemodynamically significant arterial disease in the internal carotid artery bilaterally. There is mild heterogeneous plaque noted in bilateral carotid  arteries. Antegrade right vertebral artery flow. Antegrade left vertebral artery flow.  PCV RENAL/RENAL ARTERY DUPLEX COMPLETE  Result Date: 04/15/2020 Renal artery duplex  04/11/2020: Hemodynamically significant stenosis of the right renal artery suggestive of >50% stenosis. Renal length is within normal limits for both kidneys. Normal abdominal aorta flow velocities noted. NO significant plaque noted in the abdominal aorta.  PCV MYOCARDIAL PERFUSION WO LEXISCAN  Result Date: 03/26/2020 Exercise Sestamibi stress test 03/26/2020: Lexiscan/modified  Bruce nuclear stress test performed using 1-day protocol. Patient achieved 2.3 mets and reached 78% MPHR. Stress EKG is non-diagnostic, as this is pharmacological stress test. In addition, stress EKG at 78% MPHR showed nonspecific ST-T changes in inferior leads. Normal myocardial perfusion. Stress LVEF 61%. Low risk study.     Subjective: No new complaints.   Discharge Exam: Vitals:   04/19/20 1956 04/20/20 0535  BP: 135/75 (!) 140/92  Pulse: 83 75  Resp: 20 19  Temp: 98.3 F (36.8 C) 97.8 F (36.6 C)  SpO2: 100% 100%   Vitals:   04/19/20 0412 04/19/20 1353 04/19/20 1956 04/20/20 0535  BP: (!) 151/83 138/77 135/75 (!) 140/92  Pulse: 81 80 83 75  Resp: '18 18 20 19  '$ Temp: 98 F (36.7 C) 98.2 F (36.8 C) 98.3 F (36.8 C) 97.8 F (36.6 C)  TempSrc:  Oral Oral Oral  SpO2: 100% 100% 100% 100%  Weight:      Height:        General: Pt is alert, awake, not in acute distress Cardiovascular: RRR, S1/S2 +, no rubs, no gallops Respiratory: CTA bilaterally, no wheezing, no rhonchi Abdominal: Soft, NT, ND, bowel sounds + Extremities: no edema, no cyanosis    The results of significant diagnostics from this hospitalization (including imaging, microbiology, ancillary and laboratory) are listed below for reference.     Microbiology: Recent Results (from the past 240 hour(s))  Resp Panel by RT-PCR (Flu A&B, Covid) Nasopharyngeal Swab     Status: None   Collection Time: 04/15/20 11:22 PM   Specimen: Nasopharyngeal Swab; Nasopharyngeal(NP) swabs in vial transport medium  Result Value Ref Range Status   SARS Coronavirus 2 by RT PCR NEGATIVE NEGATIVE Final    Comment: (NOTE) SARS-CoV-2 target nucleic acids are NOT DETECTED.  The SARS-CoV-2 RNA is generally detectable in upper respiratory specimens during the acute phase of infection. The lowest concentration of SARS-CoV-2 viral copies this assay can detect is 138 copies/mL. A negative result does not preclude SARS-Cov-2 infection  and should not be used as the sole basis for treatment or other patient management decisions. A negative result may occur with  improper specimen collection/handling, submission of specimen other than nasopharyngeal swab, presence of viral mutation(s) within the areas targeted by this assay, and inadequate number of viral copies(<138 copies/mL). A negative result must be combined with clinical observations, patient history, and epidemiological information. The expected result is Negative.  Fact Sheet for Patients:  EntrepreneurPulse.com.au  Fact Sheet for Healthcare Providers:  IncredibleEmployment.be  This test is no t yet approved or cleared by the [place] FDA and  has been authorized for detection and/or diagnosis of SARS-CoV-2 by FDA under an Emergency Use Authorization (EUA). This EUA will remain  in effect (meaning this test can be used) for the duration of the COVID-19 declaration under Section 564(b)(1) of the Act, 21 U.S.C.section 360bbb-3(b)(1), unless the authorization is terminated  or revoked sooner.       Influenza A by PCR NEGATIVE NEGATIVE Final   Influenza B by PCR  NEGATIVE NEGATIVE Final    Comment: (NOTE) The Xpert Xpress SARS-CoV-2/FLU/RSV plus assay is intended as an aid in the diagnosis of influenza from Nasopharyngeal swab specimens and should not be used as a sole basis for treatment. Nasal washings and aspirates are unacceptable for Xpert Xpress SARS-CoV-2/FLU/RSV testing.  Fact Sheet for Patients: EntrepreneurPulse.com.au  Fact Sheet for Healthcare Providers: IncredibleEmployment.be  This test is not yet approved or cleared by the [place] FDA and has been authorized for detection and/or diagnosis of SARS-CoV-2 by FDA under an Emergency Use Authorization (EUA). This EUA will remain in effect (meaning this test can be used) for the duration of the COVID-19 declaration  under Section 564(b)(1) of the Act, 21 U.S.C. section 360bbb-3(b)(1), unless the authorization is terminated or revoked.  Performed at [facility], [place] [address] [place], [place] [address]   Culture, Urine     Status: Abnormal   Collection Time: 04/16/20  4:26 AM   Specimen: Urine, Clean Catch  Result Value Ref Range Status   Specimen Description   Final    URINE, CLEAN CATCH Performed at [facility], [place] [address] [place], [place] [address]    Special Requests   Final    NONE Performed at [facility], [place] [address], [place], [place] [address]    Culture (A)  Final    <10,000 COLONIES/mL INSIGNIFICANT GROWTH Performed at [place] [address] [place], [place] [address]    Report Status 04/17/2020 FINAL  Final     Labs: BNP (last 3 results) No results for input(s): BNP in the last 8760 hours. Basic Metabolic Panel: Recent Labs  Lab 04/16/20 0426 04/17/20 0353 04/18/20 0406 04/19/20 0402 04/20/20 0556  NA 132* 134* 137 137 134*  K 3.1* 3.6 4.0 4.4 4.4  CL 94* 97* 99 98 98  CO2 '26 26 25 27 24  '$ GLUCOSE 266* 322* 330* 237* 135*  BUN 96* 89* 87* 90* 80*  CREATININE 4.73* 4.47* 3.77* 4.25* 3.70*  CALCIUM 8.1* 8.3* 9.1 9.1 8.7*   Liver Function Tests: No results for input(s): AST, ALT, ALKPHOS, BILITOT, PROT, ALBUMIN in the last 168 hours. No results for input(s): LIPASE, AMYLASE in the last 168 hours. No results for input(s): AMMONIA in the last 168 hours. CBC: Recent Labs  Lab 04/14/20 1247 04/15/20 2127  WBC 7.5 7.0  HGB 11.0* 11.3*  HCT 33.3* 35.5*  MCV 88.8 92.0  PLT 332 339   Cardiac Enzymes: No results for input(s): CKTOTAL, CKMB, CKMBINDEX, TROPONINI in the last 168 hours. BNP: Invalid input(s): POCBNP CBG: Recent Labs  Lab 04/19/20 1114 04/19/20 1644 04/19/20 1733 04/19/20 2132 04/20/20 0732  GLUCAP 269* 64* 87 270* 164*   D-Dimer No results for input(s): DDIMER  in the last 72 hours. Hgb A1c No results for input(s): HGBA1C in the last 72 hours. Lipid Profile No results for input(s): CHOL, HDL, LDLCALC, TRIG, CHOLHDL, LDLDIRECT in the last 72 hours. Thyroid function studies No results for input(s): TSH, T4TOTAL, T3FREE, THYROIDAB in the last 72 hours.  Invalid input(s): FREET3 Anemia work up No results for input(s): VITAMINB12, FOLATE, FERRITIN, TIBC, IRON, RETICCTPCT in the last 72 hours. Urinalysis    Component Value Date/Time   COLORURINE YELLOW 04/16/2020 0003   APPEARANCEUR CLEAR 04/16/2020 0003   LABSPEC 1.011 04/16/2020 0003   PHURINE 5.0 04/16/2020 0003   GLUCOSEU >=500 (A) 04/16/2020 0003   HGBUR SMALL (A) 04/16/2020 0003   BILIRUBINUR NEGATIVE 04/16/2020 0003  KETONESUR NEGATIVE 04/16/2020 0003   PROTEINUR 30 (A) 04/16/2020 0003   UROBILINOGEN 0.2 12/19/2015 1917   NITRITE NEGATIVE 04/16/2020 0003   LEUKOCYTESUR LARGE (A) 04/16/2020 0003   Sepsis Labs Invalid input(s): PROCALCITONIN,  WBC,  LACTICIDVEN Microbiology Recent Results (from the past 240 hour(s))  Resp Panel by RT-PCR (Flu A&B, Covid) Nasopharyngeal Swab     Status: None   Collection Time: 04/15/20 11:22 PM   Specimen: Nasopharyngeal Swab; Nasopharyngeal(NP) swabs in vial transport medium  Result Value Ref Range Status   SARS Coronavirus 2 by RT PCR NEGATIVE NEGATIVE Final    Comment: (NOTE) SARS-CoV-2 target nucleic acids are NOT DETECTED.  The SARS-CoV-2 RNA is generally detectable in upper respiratory specimens during the acute phase of infection. The lowest concentration of SARS-CoV-2 viral copies this assay can detect is 138 copies/mL. A negative result does not preclude SARS-Cov-2 infection and should not be used as the sole basis for treatment or other patient management decisions. A negative result may occur with  improper specimen collection/handling, submission of specimen other than nasopharyngeal swab, presence of viral mutation(s) within  the areas targeted by this assay, and inadequate number of viral copies(<138 copies/mL). A negative result must be combined with clinical observations, patient history, and epidemiological information. The expected result is Negative.  Fact Sheet for Patients:  EntrepreneurPulse.com.au  Fact Sheet for Healthcare Providers:  IncredibleEmployment.be  This test is no t yet approved or cleared by the [place] FDA and  has been authorized for detection and/or diagnosis of SARS-CoV-2 by FDA under an Emergency Use Authorization (EUA). This EUA will remain  in effect (meaning this test can be used) for the duration of the COVID-19 declaration under Section 564(b)(1) of the Act, 21 U.S.C.section 360bbb-3(b)(1), unless the authorization is terminated  or revoked sooner.       Influenza A by PCR NEGATIVE NEGATIVE Final   Influenza B by PCR NEGATIVE NEGATIVE Final    Comment: (NOTE) The Xpert Xpress SARS-CoV-2/FLU/RSV plus assay is intended as an aid in the diagnosis of influenza from Nasopharyngeal swab specimens and should not be used as a sole basis for treatment. Nasal washings and aspirates are unacceptable for Xpert Xpress SARS-CoV-2/FLU/RSV testing.  Fact Sheet for Patients: EntrepreneurPulse.com.au  Fact Sheet for Healthcare Providers: IncredibleEmployment.be  This test is not yet approved or cleared by the [place] FDA and has been authorized for detection and/or diagnosis of SARS-CoV-2 by FDA under an Emergency Use Authorization (EUA). This EUA will remain in effect (meaning this test can be used) for the duration of the COVID-19 declaration under Section 564(b)(1) of the Act, 21 U.S.C. section 360bbb-3(b)(1), unless the authorization is terminated or revoked.  Performed at [facility], [place] [address] [place], [place] [address]   Culture, Urine     Status: Abnormal    Collection Time: 04/16/20  4:26 AM   Specimen: Urine, Clean Catch  Result Value Ref Range Status   Specimen Description   Final    URINE, CLEAN CATCH Performed at [facility], [place] [address], [place], [place] [address]    Special Requests   Final    NONE Performed at [facility], [place] [address], [place], [place] [address]    Culture (A)  Final    <10,000 COLONIES/mL INSIGNIFICANT GROWTH Performed at [place] [address] [place], [place] [address]    Report Status 04/17/2020 FINAL  Final     Time coordinating discharge: 33 minutes.  SIGNED:   Hosie Poisson, MD  [facility] 04/20/2020, 9:04 AM

## 2020-04-24 NOTE — ED Provider Notes (Deleted)
[place] DEPT Provider Note   CSN: [id number] Arrival date & time: 04/15/20  2119     History Chief Complaint  Patient presents with  . Hyperglycemia    Felicia Guerrero is a 50 y.o. female.   Hyperglycemia Associated symptoms: no abdominal pain, no chest pain, no dysuria, no fever, no shortness of breath and no vomiting    50 year old female with a history of anemia, arthritis, CKD stage III, depression, DM type II, kidney stones, hypertension presents to the ER with complaints of high blood sugars.  Patient states that she recently had her insulin regimen switched up by her endocrinologist, but had to wait a couple days to get her new insulin.  Due to this gap in treatment, she was not able to establish a baseline blood sugar to restart her insulin as her glucometer read "high".  She also states that she has a history of strokes and was concerned that she may have a stroke again since her blood sugars were so high.  She denies any new neurologic deficits however.  She also complains of some new onset pain to her right toe.  She denies any injuries, but has noticed a little bit of drainage from the toenail.  He has been putting topical antibiotics on it.  She overall is in a good state of health, has no complaints at this time, no nausea, vomiting, abdominal pain, chest pain, shortness of breath.     Past Medical History:  Diagnosis Date  . Anemia    hx of  . Arthritis    right pinky osteoarthritis  . Chronic kidney disease    stage 3 ckd  . Depression    hx of  . Diabetes mellitus without complication ([place])    type 2  . Heart murmur   . History of kidney stones   . Hypertension   . Pneumothorax   . Stroke [facility])    short term memory loss and left side weakness /blindness right eye   Nov 28 2014    Patient Active Problem List   Diagnosis Date Noted  . Hyperglycemia 04/16/2020  . AKI (acute kidney injury) ([place]) 04/16/2020  .  Onychomycosis 04/16/2020  . HTN (hypertension) 04/16/2020  . HLD (hyperlipidemia) 04/16/2020  . Hyperglycemic crisis in diabetes mellitus ([place]) 04/16/2020  . Pyelonephritis 02/17/2017  . Sepsis ([place]) 02/17/2017  . Acute renal insufficiency 02/17/2017  . Left ureteral stone 02/15/2017    Past Surgical History:  Procedure Laterality Date  . CARDIAC SURGERY     For pericardial effusion  . CESAREAN SECTION    . CHEST TUBE INSERTION    . CYSTOSCOPY W/ URETERAL STENT PLACEMENT Left 02/15/2017   Procedure: CYSTOSCOPY WITH LEFT URETERAL STENT PLACEMENT;  Surgeon: Raynelle Bring, MD;  Location: [facility];  Service: Urology;  Laterality: Left;  . CYSTOSCOPY/URETEROSCOPY/HOLMIUM LASER/STENT PLACEMENT Left 03/12/2017   Procedure: CYSTOSCOPY/URETEROSCOPY/HOLMIUM LASER/STENT PLACEMENT;  Surgeon: Raynelle Bring, MD;  Location: [facility];  Service: Urology;  Laterality: Left;  . CYSTOSCOPY/URETEROSCOPY/HOLMIUM LASER/STENT PLACEMENT Left 11/28/2019   Procedure: CYSTOSCOPY/RETROGRADE/URETEROSCOPY;  Surgeon: Raynelle Bring, MD;  Location: [facility];  Service: Urology;  Laterality: Left;  . EYE SURGERY     right eye  . HYSTERECTOMY ABDOMINAL WITH SALPINGECTOMY    . KIDNEY STONE SURGERY    . PERICARDIAL WINDOW    . WISDOM TOOTH EXTRACTION       OB History   No obstetric history on file.     Family History  Problem Relation  Age of Onset  . Diabetes Mother   . Congestive Heart Failure Mother   . Prostate cancer Father   . Diabetes Sister   . High blood pressure Brother     Social History   Tobacco Use  . Smoking status: Never Smoker  . Smokeless tobacco: Never Used  Vaping Use  . Vaping Use: Never used  Substance Use Topics  . Alcohol use: No  . Drug use: No    Home Medications Prior to Admission medications   Medication Sig Start Date End Date Taking? Authorizing Provider  acetaminophen (TYLENOL) 500 MG tablet Take 500 mg by mouth every 6 (six) hours as needed for moderate pain.   Yes  [provider]  aspirin 81 MG EC tablet Take 81 mg by mouth at bedtime.    Yes [provider]  chlorthalidone (HYGROTON) 25 MG tablet Take 25 mg by mouth daily.   Yes [provider]  ciclopirox (PENLAC) 8 % solution Apply 1 application topically 2 (two) times daily. 04/10/20  Yes [provider]  clotrimazole-betamethasone (LOTRISONE) cream Apply 1 application topically 2 (two) times daily. 04/10/20  Yes [provider]  epoetin alfa (EPOGEN) 2000 UNIT/ML injection Inject 2,000 Units into the skin every 28 (twenty-eight) days.   Yes [provider]  Magnesium Oxide 420 MG TABS Take 420 mg by mouth daily.   Yes [provider]  metoprolol succinate (TOPROL-XL) 50 MG 24 hr tablet Take 50 mg by mouth at bedtime. 09/09/19  Yes [provider]  Multiple Vitamin (MULTIVITAMIN WITH MINERALS) TABS tablet Take 1 tablet by mouth daily.   Yes [provider]  NIFEdipine (ADALAT CC) 90 MG 24 hr tablet Take 90 mg by mouth daily. 11/19/19  Yes [provider]  rosuvastatin (CRESTOR) 20 MG tablet Take 20 mg by mouth every evening. 05/27/16  Yes [provider]  Insulin Aspart, w/Niacinamide, (FIASP PENFILL) 100 UNIT/ML SOCT Inject 3 Units into the skin See admin instructions. Before each meal 04/20/20   Hosie Poisson, MD  Insulin Degludec (TRESIBA) 100 UNIT/ML SOLN Inject 22 Units into the skin at bedtime. 04/20/20   Hosie Poisson, MD    Allergies    Cephalexin  Review of Systems   Review of Systems  Constitutional: Negative for chills and fever.  HENT: Negative for ear pain and sore throat.   Eyes: Negative for pain and visual disturbance.  Respiratory: Negative for cough and shortness of breath.   Cardiovascular: Negative for chest pain and palpitations.  Gastrointestinal: Negative for abdominal pain and vomiting.  Genitourinary: Negative for dysuria and hematuria.  Musculoskeletal: Negative for arthralgias  and back pain.  Skin: Negative for color change and rash.  Neurological: Negative for seizures and syncope.  All other systems reviewed and are negative.   Physical Exam Updated Vital Signs BP (!) 140/92   Pulse 75   Temp 97.8 F (36.6 C) (Oral)   Resp 19   Ht '5\' 6"'$  (1.676 m)   Wt 64.9 kg   SpO2 100%   BMI 23.08 kg/m   Physical Exam Vitals and nursing note reviewed.  Constitutional:      General: She is not in acute distress.    Appearance: She is well-developed. She is not ill-appearing or diaphoretic.  HENT:     Head: Normocephalic and atraumatic.  Eyes:     Conjunctiva/sclera: Conjunctivae normal.  Cardiovascular:     Rate and Rhythm: Normal rate and regular rhythm.     Heart sounds:  No murmur heard.   Pulmonary:     Effort: Pulmonary effort is normal. No respiratory distress.     Breath sounds: Normal breath sounds.  Abdominal:     General: Abdomen is flat.     Palpations: Abdomen is soft.     Tenderness: There is no abdominal tenderness.  Musculoskeletal:        General: Tenderness present. Normal range of motion.     Cervical back: Neck supple.     Comments: Right toe with no significant discoloration, no evidence of necrosis.<2 cap refill.  She does have an area of redness and mild weeping to the right upper toe pad.  Full range of motion of the toe.  Skin:    General: Skin is warm and dry.  Neurological:     General: No focal deficit present.     Mental Status: She is alert and oriented to person, place, and time.     ED Results / Procedures / Treatments   Labs (all labs ordered are listed, but only abnormal results are displayed) Labs Reviewed  URINE CULTURE - Abnormal; Notable for the following components:      Result Value   Culture   (*)    Value: <10,000 COLONIES/mL INSIGNIFICANT GROWTH Performed at [facility], [address] [place], [place] [address]    All other components within normal limits  BASIC METABOLIC PANEL - Abnormal;  Notable for the following components:   Sodium 123 (*)    Chloride 81 (*)    Glucose, Bld 744 (*)    BUN 103 (*)    Creatinine, Ser 5.41 (*)    Calcium 8.2 (*)    GFR, Estimated 9 (*)    Anion gap 17 (*)    All other components within normal limits  CBC - Abnormal; Notable for the following components:   RBC 3.86 (*)    Hemoglobin 11.3 (*)    HCT 35.5 (*)    All other components within normal limits  URINALYSIS, ROUTINE W REFLEX MICROSCOPIC - Abnormal; Notable for the following components:   Glucose, UA >=500 (*)    Hgb urine dipstick SMALL (*)    Protein, ur 30 (*)    Leukocytes,Ua LARGE (*)    Bacteria, UA RARE (*)    All other components within normal limits  BLOOD GAS, VENOUS - Abnormal; Notable for the following components:   Bicarbonate 30.6 (*)    Acid-Base Excess 4.5 (*)    All other components within normal limits  BASIC METABOLIC PANEL - Abnormal; Notable for the following components:   Sodium 132 (*)    Potassium 3.1 (*)    Chloride 94 (*)    Glucose, Bld 266 (*)    BUN 96 (*)    Creatinine, Ser 4.73 (*)    Calcium 8.1 (*)    GFR, Estimated 11 (*)    All other components within normal limits  HEMOGLOBIN A1C - Abnormal; Notable for the following components:   Hgb A1c MFr Bld 12.4 (*)    All other components within normal limits  OSMOLALITY - Abnormal; Notable for the following components:   Osmolality 321 (*)    All other components within normal limits  BASIC METABOLIC PANEL - Abnormal; Notable for the following components:   Sodium 134 (*)    Chloride 97 (*)    Glucose, Bld 322 (*)    BUN 89 (*)    Creatinine, Ser 4.47 (*)    Calcium 8.3 (*)  GFR, Estimated 11 (*)    All other components within normal limits  GLUCOSE, CAPILLARY - Abnormal; Notable for the following components:   Glucose-Capillary 197 (*)    All other components within normal limits  GLUCOSE, CAPILLARY - Abnormal; Notable for the following components:   Glucose-Capillary 381 (*)     All other components within normal limits  GLUCOSE, CAPILLARY - Abnormal; Notable for the following components:   Glucose-Capillary 286 (*)    All other components within normal limits  GLUCOSE, CAPILLARY - Abnormal; Notable for the following components:   Glucose-Capillary 394 (*)    All other components within normal limits  GLUCOSE, CAPILLARY - Abnormal; Notable for the following components:   Glucose-Capillary 180 (*)    All other components within normal limits  BASIC METABOLIC PANEL - Abnormal; Notable for the following components:   Glucose, Bld 330 (*)    BUN 87 (*)    Creatinine, Ser 3.77 (*)    GFR, Estimated 14 (*)    All other components within normal limits  GLUCOSE, CAPILLARY - Abnormal; Notable for the following components:   Glucose-Capillary 261 (*)    All other components within normal limits  GLUCOSE, CAPILLARY - Abnormal; Notable for the following components:   Glucose-Capillary 314 (*)    All other components within normal limits  GLUCOSE, CAPILLARY - Abnormal; Notable for the following components:   Glucose-Capillary 313 (*)    All other components within normal limits  BASIC METABOLIC PANEL - Abnormal; Notable for the following components:   Glucose, Bld 237 (*)    BUN 90 (*)    Creatinine, Ser 4.25 (*)    GFR, Estimated 12 (*)    All other components within normal limits  GLUCOSE, CAPILLARY - Abnormal; Notable for the following components:   Glucose-Capillary 296 (*)    All other components within normal limits  GLUCOSE, CAPILLARY - Abnormal; Notable for the following components:   Glucose-Capillary 222 (*)    All other components within normal limits  GLUCOSE, CAPILLARY - Abnormal; Notable for the following components:   Glucose-Capillary 269 (*)    All other components within normal limits  GLUCOSE, CAPILLARY - Abnormal; Notable for the following components:   Glucose-Capillary 64 (*)    All other components within normal limits  BASIC METABOLIC  PANEL - Abnormal; Notable for the following components:   Sodium 134 (*)    Glucose, Bld 135 (*)    BUN 80 (*)    Creatinine, Ser 3.70 (*)    Calcium 8.7 (*)    GFR, Estimated 14 (*)    All other components within normal limits  GLUCOSE, CAPILLARY - Abnormal; Notable for the following components:   Glucose-Capillary 270 (*)    All other components within normal limits  GLUCOSE, CAPILLARY - Abnormal; Notable for the following components:   Glucose-Capillary 164 (*)    All other components within normal limits  GLUCOSE, CAPILLARY - Abnormal; Notable for the following components:   Glucose-Capillary 203 (*)    All other components within normal limits  CBG MONITORING, ED - Abnormal; Notable for the following components:   Glucose-Capillary >600 (*)    All other components within normal limits  CBG MONITORING, ED - Abnormal; Notable for the following components:   Glucose-Capillary >600 (*)    All other components within normal limits  CBG MONITORING, ED - Abnormal; Notable for the following components:   Glucose-Capillary 543 (*)    All other components within normal limits  CBG MONITORING, ED - Abnormal; Notable for the following components:   Glucose-Capillary 450 (*)    All other components within normal limits  CBG MONITORING, ED - Abnormal; Notable for the following components:   Glucose-Capillary 401 (*)    All other components within normal limits  CBG MONITORING, ED - Abnormal; Notable for the following components:   Glucose-Capillary 287 (*)    All other components within normal limits  CBG MONITORING, ED - Abnormal; Notable for the following components:   Glucose-Capillary 143 (*)    All other components within normal limits  CBG MONITORING, ED - Abnormal; Notable for the following components:   Glucose-Capillary 112 (*)    All other components within normal limits  CBG MONITORING, ED - Abnormal; Notable for the following components:   Glucose-Capillary 183 (*)     All other components within normal limits  CBG MONITORING, ED - Abnormal; Notable for the following components:   Glucose-Capillary 195 (*)    All other components within normal limits  CBG MONITORING, ED - Abnormal; Notable for the following components:   Glucose-Capillary 168 (*)    All other components within normal limits  CBG MONITORING, ED - Abnormal; Notable for the following components:   Glucose-Capillary 139 (*)    All other components within normal limits  RESP PANEL BY RT-PCR (FLU A&B, COVID) ARPGX2  BETA-HYDROXYBUTYRIC ACID  HIV ANTIBODY (ROUTINE TESTING W REFLEX)  SODIUM, URINE, RANDOM  CREATININE, URINE, RANDOM  GLUCOSE, CAPILLARY  GLUCOSE, CAPILLARY  I-STAT BETA HCG BLOOD, ED ([facility], [facility], [facility] ONLY)  CBG MONITORING, ED    EKG None  Radiology No results found.  Procedures .Critical Care Performed by: Garald Balding, PA-C Authorized by: Garald Balding, PA-C   Critical care provider statement:    Critical care time (minutes):  45   Critical care was time spent personally by me on the following activities:  Discussions with consultants, evaluation of patient's response to treatment, examination of patient, ordering and performing treatments and interventions, ordering and review of laboratory studies, ordering and review of radiographic studies, pulse oximetry, re-evaluation of patient's condition, obtaining history from patient or surrogate and review of old charts     Medications Ordered in ED Medications  0.9 %  sodium chloride infusion ( Intravenous New Bag/Given 04/16/20 1440)  sodium chloride 0.9 % bolus 1,000 mL (0 mLs Intravenous Stopped 04/16/20 0412)    Followed by  sodium chloride 0.9 % bolus 1,000 mL (0 mLs Intravenous Stopped 04/16/20 0158)  insulin glargine (LANTUS) injection 5 Units (5 Units Subcutaneous Given 04/17/20 1201)    ED Course  I have reviewed the triage vital signs and the nursing notes.  Pertinent labs & imaging results that were  available during my care of the patient were reviewed by me and considered in my medical decision making (see chart for details).    MDM Rules/Calculators/A&P                         50 year old female who presents to the ER with complaints of hyperglycemia.  Had a lapse in her insulin, and has not taken it in the last several days.  On arrival, vitals overall reassuring.  She was overall very well-appearing, has no complaints at this time other than right toe pain which has been ongoing for several weeks.  Labs here not consistent with DKA or HHS, she is hyponatremic but her corrected sodium is 136.  Glucose of 705,  creatinine of 4.72 which is slightly elevated from baseline.  She also has mild hypocalcemia of 8.7.  Negative anion gap, CO2 was normal.  UA without evidence of ketones, no evidence of UTI.  hCG here 5.4, I believe this is false.    She hated hyperglycemic fluid resuscitation, patient received 10 units of NovoLog here in the ER.  Improved CBG to 480.  Given complaints of right toe pain, I did order x-rays to check for osteomyelitis.  This was overall normal, no soft tissue swelling or gas.  Patient states that she has the insulin and information that she needs to initiate proper treatment when she gets home.  She is comfortable with going home and resuming her treatment.  We will start her on doxycycline for questionable toe infection in the setting of diabetes.  Stressed follow-up with her endocrinologist, return precautions discussed.  She voiced understanding is agreeable.  This was a shared visit with my supervising physician Dr. Ralene Bathe who independently saw and evaluated the patient & provided guidance in evaluation/management/disposition ,in agreement with care   Final Clinical Impression(s) / ED Diagnoses Final diagnoses:  Hyperglycemia    Rx / DC Orders ED Discharge Orders         Ordered    Insulin Aspart, w/Niacinamide, (FIASP PENFILL) 100 UNIT/ML SOCT  See admin  instructions        04/20/20 0858    Insulin Degludec (TRESIBA) 100 UNIT/ML SOLN  Daily at bedtime        04/20/20 0858    Increase activity slowly        04/20/20 0858    Diet - low sodium heart healthy        04/20/20 0858               Quintella Reichert, MD 04/14/20 [place], Leslie, PA-C 04/24/20 [phone]

## 2020-05-01 ENCOUNTER — Ambulatory Visit (HOSPITAL_COMMUNITY)
Admission: RE | Admit: 2020-05-01 | Discharge: 2020-05-01 | Disposition: A | Discharge status: Home / Self Care | Payer: BC Managed Care – PPO | Source: Ambulatory Visit | Attending: Nephrology | Admitting: Nephrology

## 2020-05-01 ENCOUNTER — Other Ambulatory Visit: Payer: Self-pay

## 2020-05-01 VITALS — BP 136/74 | HR 79 | Temp 97.7°F

## 2020-05-01 DIAGNOSIS — N289 Disorder of kidney and ureter, unspecified: Secondary | ICD-10-CM | POA: Diagnosis present

## 2020-05-01 LAB — RENAL FUNCTION PANEL
Albumin: 3.6 g/dL (ref 3.5–5.0)
Anion gap: 10 (ref 5–15)
BUN: 84 mg/dL — ABNORMAL HIGH (ref 6–20)
CO2: 29 mmol/L (ref 22–32)
Calcium: 8.7 mg/dL — ABNORMAL LOW (ref 8.9–10.3)
Chloride: 99 mmol/L (ref 98–111)
Creatinine, Ser: 4.26 mg/dL — ABNORMAL HIGH (ref 0.44–1.00)
GFR, Estimated: 12 mL/min — ABNORMAL LOW (ref >60)
Glucose, Bld: 125 mg/dL — ABNORMAL HIGH (ref 70–99)
Phosphorus: 6.5 mg/dL — ABNORMAL HIGH (ref 2.5–4.6)
Potassium: 4.3 mmol/L (ref 3.5–5.1)
Sodium: 138 mmol/L (ref 135–145)

## 2020-05-01 LAB — FERRITIN: Ferritin: 685 ng/mL — ABNORMAL HIGH (ref 11–307)

## 2020-05-01 LAB — IRON AND TIBC
Iron: 44 ug/dL (ref 28–170)
Saturation Ratios: 16 % (ref 10.4–31.8)
TIBC: 269 ug/dL (ref 250–450)
UIBC: 225 ug/dL

## 2020-05-01 LAB — POCT HEMOGLOBIN-HEMACUE: Hemoglobin: 9.5 g/dL — ABNORMAL LOW (ref 12.0–15.0)

## 2020-05-01 MED ORDER — EPOETIN ALFA-EPBX 10000 UNIT/ML IJ SOLN
20000.0000 [IU] | INTRAMUSCULAR | Status: DC
  Administered 2020-05-01: 20000 [IU] via SUBCUTANEOUS

## 2020-05-01 MED ORDER — EPOETIN ALFA-EPBX 10000 UNIT/ML IJ SOLN
INTRAMUSCULAR | Status: AC
  Filled 2020-05-01: qty 2

## 2020-05-03 ENCOUNTER — Ambulatory Visit: Payer: BC Managed Care – PPO | Admitting: Student

## 2020-05-03 ENCOUNTER — Ambulatory Visit: Payer: BC Managed Care – PPO | Admitting: Cardiology

## 2020-05-03 NOTE — H&P (View-Only) (Signed)
Primary Physician/Referring:  Bartholome Bill, MD  Patient ID: Felicia Guerrero, female    DOB: 02-Nov-1970, 50 y.o.   MRN: [id number]  Chief Complaint  Patient presents with  . Hypertension  . per operative  . Follow-up    6 weeks   HPI:    Felicia Guerrero  is a 50 y.o. African-American female with extensive medical history, pertinent to cardiac include hypertension, hyperlipidemia, diabetes mellitus with diabetic retinopathy and diabetic chronic kidney disease, history of stroke in 2016 with left-sided weakness has mostly resolved, pericardial effusion and history of pericardial window 06/21/2018 referred to me for pretransplant cardiac work-up. She is asymptomatic.   Presents for 6-week follow-up of hypertension and preoperative evaluation, as well as results of cardiac testing.  Since last visit patient was hospitalized 04/16/2020-04/20/2020 with hyperglycemia and acute kidney injury superimposed on stage IV chronic kidney disease.  She was discharged and recommended for close follow-up with nephrology and diabetic management team.  She is without specific complaints today.  Unfortunately she has not been monitoring her blood pressure on a regular basis at home.  Denies chest pain, palpitations, dyspnea, syncope, near syncope.  Denies symptoms suggestive of TIA/CVA.  Past Medical History:  Diagnosis Date  . Anemia    hx of  . Arthritis    right pinky osteoarthritis  . Chronic kidney disease    stage 3 ckd  . Depression    hx of  . Diabetes mellitus without complication ([place])    type 2  . Heart murmur   . History of kidney stones   . Hypertension   . Pneumothorax   . Stroke [facility])    short term memory loss and left side weakness /blindness right eye   Nov 28 2014   Past Surgical History:  Procedure Laterality Date  . CARDIAC SURGERY     For pericardial effusion  . CESAREAN SECTION    . CHEST TUBE INSERTION    . CYSTOSCOPY W/ URETERAL STENT PLACEMENT Left  02/15/2017   Procedure: CYSTOSCOPY WITH LEFT URETERAL STENT PLACEMENT;  Surgeon: Raynelle Bring, MD;  Location: [facility];  Service: Urology;  Laterality: Left;  . CYSTOSCOPY/URETEROSCOPY/HOLMIUM LASER/STENT PLACEMENT Left 03/12/2017   Procedure: CYSTOSCOPY/URETEROSCOPY/HOLMIUM LASER/STENT PLACEMENT;  Surgeon: Raynelle Bring, MD;  Location: [facility];  Service: Urology;  Laterality: Left;  . CYSTOSCOPY/URETEROSCOPY/HOLMIUM LASER/STENT PLACEMENT Left 11/28/2019   Procedure: CYSTOSCOPY/RETROGRADE/URETEROSCOPY;  Surgeon: Raynelle Bring, MD;  Location: [facility];  Service: Urology;  Laterality: Left;  . EYE SURGERY     right eye  . HYSTERECTOMY ABDOMINAL WITH SALPINGECTOMY    . KIDNEY STONE SURGERY    . PERICARDIAL WINDOW    . WISDOM TOOTH EXTRACTION     Family History  Problem Relation Age of Onset  . Diabetes Mother   . Congestive Heart Failure Mother   . Prostate cancer Father   . Diabetes Sister   . High blood pressure Brother     Social History   Tobacco Use  . Smoking status: Never Smoker  . Smokeless tobacco: Never Used  Substance Use Topics  . Alcohol use: No   Marital Status: Married  ROS  Review of Systems  Constitutional: Negative for malaise/fatigue and weight gain.  Cardiovascular: Negative for chest pain, claudication, dyspnea on exertion, leg swelling, near-syncope, orthopnea, palpitations, paroxysmal nocturnal dyspnea and syncope.  Respiratory: Negative for shortness of breath.   Hematologic/Lymphatic: Does not bruise/bleed easily.  Gastrointestinal: Negative for melena.  Neurological: Negative for dizziness and weakness.   Objective  Blood pressure (!) 157/82, pulse 84, temperature 98.4 F (36.9 C), resp. rate 16, height _0  (1.676 m), weight 149 lb (67.6 kg), SpO2 100 %.  Vitals with BMI 05/04/2020 05/04/2020 05/01/2020  Height - _1  -  Weight - 149 lbs -  BMI - 92.49 -  Systolic 324 199 144  Diastolic 82 85 74  Pulse 84 87 79     Physical Exam Constitutional:       General: She is not in acute distress.    Appearance: She is normal weight.  Cardiovascular:     Rate and Rhythm: Normal rate and regular rhythm.     Pulses: Normal pulses and intact distal pulses.          Carotid pulses are on the right side with bruit.    Heart sounds: Normal heart sounds. No murmur heard. No gallop.      Comments: No leg edema, no JVD. Prominent abdominal bruit Pulmonary:     Effort: Pulmonary effort is normal.     Breath sounds: Normal breath sounds.  Abdominal:     General: Bowel sounds are normal.     Palpations: Abdomen is soft.  Musculoskeletal:     Cervical back: Normal range of motion.  Skin:    General: Skin is warm and dry.  Neurological:     Mental Status: She is oriented to person, place, and time.    Laboratory examination:   Recent Labs    10/04/19 0808 11/01/19 0800 11/24/19 1424 11/29/19 0812 04/19/20 0402 04/20/20 0556 05/01/20 0811  NA 137 136 136   < > 137 134* 138  K 3.5 4.5 4.1   < > 4.4 4.4 4.3  CL 98 99 95*   < > 98 98 99  CO2 _2 < > _3 GLUCOSE 250* 195* 264*   < > 237* 135* 125*  BUN 112* 93* 94*   < > 90* 80* 84*  CREATININE 4.85* 5.07* 4.14*   < > 4.25* 3.70* 4.26*  CALCIUM 10.0 9.4 9.9   < > 9.1 8.7* 8.7*  GFRNONAA 10* 9* 12*   < > 12* 14* 12*  GFRAA 11* 11* 14*  --   --   --   --    < > = values in this interval not displayed.   estimated creatinine clearance is 14.8 mL/min (A) (by C-G formula based on SCr of 4.26 mg/dL (H)).  CMP Latest Ref Rng & Units 05/01/2020 04/20/2020 04/19/2020  Glucose 70 - 99 mg/dL 125(H) 135(H) 237(H)  BUN 6 - 20 mg/dL 84(H) 80(H) 90(H)  Creatinine 0.44 - 1.00 mg/dL 4.26(H) 3.70(H) 4.25(H)  Sodium 135 - 145 mmol/L 138 134(L) 137  Potassium 3.5 - 5.1 mmol/L 4.3 4.4 4.4  Chloride 98 - 111 mmol/L 99 98 98  CO2 22 - 32 mmol/L _4 Calcium 8.9 - 10.3 mg/dL 8.7(L) 8.7(L) 9.1  Total Protein 6.5 - 8.1 g/dL - - -  Total Bilirubin 0.3 - 1.2 mg/dL - - -  Alkaline Phos 38 -  126 U/L - - -  AST 15 - 41 U/L - - -  ALT 14 - 54 U/L - - -   CBC Latest Ref Rng & Units 05/01/2020 04/15/2020 04/14/2020  WBC 4.0 - 10.5 K/uL - 7.0 7.5  Hemoglobin 12.0 - 15.0 g/dL 9.5(L) 11.3(L) 11.0(L)  Hematocrit 36.0 - 46.0 % - 35.5(L) 33.3(L)  Platelets 150 - 400 K/uL - 339 332  Lipid Panel Recent Labs    04/02/20 0836  CHOL 164  TRIG 92  LDLCALC 89  HDL 58    HEMOGLOBIN A1C Lab Results  Component Value Date   HGBA1C 12.4 (H) 04/16/2020   MPG 309 04/16/2020   TSH Recent Labs    04/02/20 0836  TSH 1.400    External labs:   Labs 03/09/2020:  Serum creatinine 4.6, EGFR 10 mL. O+. A1c 9.7%. CHOL 104 12/08/2018  TRIG 85 12/08/2018  HDL 44 12/08/2018  LDL 43 12/08/2018    Medications and allergies   Allergies  Allergen Reactions  . Cephalexin Diarrhea     Outpatient Medications Prior to Visit  Medication Sig Dispense Refill  . acetaminophen (TYLENOL) 500 MG tablet Take 500 mg by mouth every 6 (six) hours as needed for moderate pain.    Marland Kitchen aspirin 81 MG EC tablet Take 81 mg by mouth at bedtime.     . chlorthalidone (HYGROTON) 25 MG tablet Take 25 mg by mouth daily.    . ciclopirox (PENLAC) 8 % solution Apply 1 application topically 2 (two) times daily.    Marland Kitchen epoetin alfa (EPOGEN) 2000 UNIT/ML injection Inject 2,000 Units into the skin every 28 (twenty-eight) days.    . Insulin Aspart, w/Niacinamide, (FIASP PENFILL) 100 UNIT/ML SOCT Inject 3 Units into the skin See admin instructions. Before each meal (Patient taking differently: Inject 6 Units into the skin See admin instructions. Before each meal)    . Insulin Degludec (TRESIBA) 100 UNIT/ML SOLN Inject 22 Units into the skin at bedtime. (Patient taking differently: Inject 18 Units into the skin at bedtime.)    . Magnesium Oxide 420 MG TABS Take 420 mg by mouth daily.    . metoprolol succinate (TOPROL-XL) 50 MG 24 hr tablet Take 50 mg by mouth 2 (two) times daily.    . Multiple Vitamin (MULTIVITAMIN WITH  MINERALS) TABS tablet Take 1 tablet by mouth daily.    Marland Kitchen NIFEdipine (ADALAT CC) 90 MG 24 hr tablet Take 90 mg by mouth daily.    . rosuvastatin (CRESTOR) 20 MG tablet Take 20 mg by mouth every evening.    . clotrimazole-betamethasone (LOTRISONE) cream Apply 1 application topically 2 (two) times daily.     No facility-administered medications prior to visit.    Radiology:   XR CHEST PA AND LATERAL 1. Normal heart size. Questionable enlargement of the main pulmonary artery. 2. Clear lungs. 3. No pleural abnormality.   Cardiac Studies:   PCV ECHOCARDIOGRAM COMPLETE 74/16/3845 Normal LV systolic function with visual EF 55-60%. Left ventricle cavity is normal in size. Mild left ventricular hypertrophy. Normal global wall motion. Normal diastolic filling pattern, normal LAP. Mild (Grade I) mitral regurgitation. Mild to moderate tricuspid regurgitation. No evidence of pulmonary hypertension. No prior study for comparison.  Carotid artery duplex 04/11/2020:  No hemodynamically significant arterial disease in the internal carotid  artery bilaterally.  There is mild heterogeneous plaque noted in bilateral carotid arteries.  Antegrade right vertebral artery flow. Antegrade left vertebral artery  Flow.  Renal artery duplex 04/11/2020:  Hemodynamically significant stenosis of the right renal artery suggestive  of >50% stenosis.  Renal length is within normal limits for both kidneys.  Normal abdominal aorta flow velocities noted. NO significant plaque noted  in the abdominal aorta.    PCV MYOCARDIAL PERFUSION WO LEXISCAN 03/26/2020 Lexiscan/modified Bruce nuclear stress test performed using 1-day protocol. Patient achieved 2.3 mets and reached 78% MPHR. Stress EKG is non-diagnostic, as this is pharmacological stress  test. In addition, stress EKG at 78% MPHR showed nonspecific ST-T changes in inferior leads. Normal myocardial perfusion. Stress LVEF 61%. Low risk study.  EKG:   EKG  03/22/2020: Normal sinus rhythm at rate of 91 bpm, left atrial enlargement, normal axis.  Nonspecific T abnormality.      Assessment     ICD-10-CM   1. Right renal artery stenosis (HCC)  I70.1 CBC    Basic metabolic panel    Novel Coronavirus, NAA ([facility])  2. Primary hypertension  I10 CANCELED: EKG 12-Lead  3. Hypercholesteremia  E78.00   4. Controlled type 2 diabetes mellitus with complication, with long-term current use of insulin (HCC)  E11.8    Z79.4   5. Pre-operative cardiovascular examination, high risk surgery  Z01.810   6. Kidney transplant candidate  Z76.82      Medications Discontinued During This Encounter  Medication Reason  . clotrimazole-betamethasone (LOTRISONE) cream Error    Meds ordered this encounter  Medications  . hydrALAZINE (APRESOLINE) 25 MG tablet    Sig: Take 1 tablet (25 mg total) by mouth 3 (three) times daily.    Dispense:  270 tablet    Refill:  3   Orders Placed This Encounter  Procedures  . Novel Coronavirus, NAA ([facility])    Order Specific Question:   Is this test for diagnosis or screening    Answer:   Screening    Order Specific Question:   Symptomatic for COVID-19 as defined by CDC    Answer:   No    Order Specific Question:   Hospitalized for COVID-19    Answer:   No    Order Specific Question:   Admitted to ICU for COVID-19    Answer:   No    Order Specific Question:   Previously tested for COVID-19    Answer:   Yes    Order Specific Question:   Resident in a congregate (group) care setting    Answer:   No    Order Specific Question:   Is the patient student?    Answer:   No    Order Specific Question:   Employed in healthcare setting    Answer:   No    Order Specific Question:   Has patient completed COVID vaccination(s) (2 doses of Pfizer/Moderna 1 dose of [employer])    Answer:   Yes    Order Specific Question:   Has patient completed COVID Booster / 3rd dose    Answer:   Yes    Order Specific Question:   Pregnant     Answer:   No  . CBC  . Basic metabolic panel    Recommendations:   Felicia Guerrero is a 50 y.o. African-American female with extensive medical history, pertinent to cardiac include hypertension, hyperlipidemia, diabetes mellitus with diabetic retinopathy and diabetic chronic kidney disease, history of stroke in 2016 with left-sided weakness has mostly resolved, pericardial effusion and history of pericardial window 06/21/2018 referred to me for pretransplant cardiac work-up.  Patient has remained stable with chronic stage IV kidney disease and has been screened at [facility] for kidney transplant.    Patient presents for 6-week follow-up of hypertension and results of cardiac testing for preoperative evaluation.  Since last visit patient has undergone nuclear stress test which was low risk, carotid artery duplex which showed minimal plaque bilaterally, echocardiogram with normal LVEF, and renal artery duplex that revealed right renal artery stenosis.  Patient's lipids are well controlled  and TSH is normal.  Discussed results of renal artery duplex and renal artery stenosis, and reviewed management options with the patient including proceeding with angiography in the hopes that if stenosis is amenable to intervention will improve both renal function and blood pressure control.  Discussed at length with patient regarding risks versus benefits as well as potential complications of procedure, she verbalized understanding and wishes to proceed with angiography.  We will schedule her for CO2 angiography in view of chronic kidney disease.  In regard to hypertension, he remains uncontrolled.  We will add hydralazine 25 mg 3 times daily.  Encourage patient to monitor blood pressure on a daily basis and notify our office if it remains elevated above goal.  We will reevaluate preoperative recommendations for kidney transplant following renal angiography.  Follow-up in approximately 4  weeks following procedure.  Patient was seen in collaboration with Dr. Einar Gip and he is in agreement with the plan.   Alethia Berthold, PA-C 05/04/2020, 1:01 PM Office: [phone]

## 2020-05-03 NOTE — Progress Notes (Signed)
 Primary Physician/Referring:  Boyd, Tammy Lamonica, MD  Patient ID: Felicia Guerrero, female    DOB: 12/02/1970, 50 y.o.   MRN: [id number]  Chief Complaint  Patient presents with  . Hypertension  . per operative  . Follow-up    6 weeks   HPI:    Analilia Lynele Guerrero  is a 50 y.o. African-American female with extensive medical history, pertinent to cardiac include hypertension, hyperlipidemia, diabetes mellitus with diabetic retinopathy and diabetic chronic kidney disease, history of stroke in 2016 with left-sided weakness has mostly resolved, pericardial effusion and history of pericardial window 06/21/2018 referred to me for pretransplant cardiac work-up. She is asymptomatic.   Presents for 6-week follow-up of hypertension and preoperative evaluation, as well as results of cardiac testing.  Since last visit patient was hospitalized 04/16/2020-04/20/2020 with hyperglycemia and acute kidney injury superimposed on stage IV chronic kidney disease.  She was discharged and recommended for close follow-up with nephrology and diabetic management team.  She is without specific complaints today.  Unfortunately she has not been monitoring her blood pressure on a regular basis at home.  Denies chest pain, palpitations, dyspnea, syncope, near syncope.  Denies symptoms suggestive of TIA/CVA.  Past Medical History:  Diagnosis Date  . Anemia    hx of  . Arthritis    right pinky osteoarthritis  . Chronic kidney disease    stage 3 ckd  . Depression    hx of  . Diabetes mellitus without complication (HCC)    type 2  . Heart murmur   . History of kidney stones   . Hypertension   . Pneumothorax   . Stroke (HCC)    short term memory loss and left side weakness /blindness right eye   Nov 28 2014   Past Surgical History:  Procedure Laterality Date  . CARDIAC SURGERY     For pericardial effusion  . CESAREAN SECTION    . CHEST TUBE INSERTION    . CYSTOSCOPY W/ URETERAL STENT PLACEMENT Left  02/15/2017   Procedure: CYSTOSCOPY WITH LEFT URETERAL STENT PLACEMENT;  Surgeon: Borden, Lester, MD;  Location: [facility];  Service: Urology;  Laterality: Left;  . CYSTOSCOPY/URETEROSCOPY/HOLMIUM LASER/STENT PLACEMENT Left 03/12/2017   Procedure: CYSTOSCOPY/URETEROSCOPY/HOLMIUM LASER/STENT PLACEMENT;  Surgeon: Borden, Lester, MD;  Location: [facility];  Service: Urology;  Laterality: Left;  . CYSTOSCOPY/URETEROSCOPY/HOLMIUM LASER/STENT PLACEMENT Left 11/28/2019   Procedure: CYSTOSCOPY/RETROGRADE/URETEROSCOPY;  Surgeon: Borden, Lester, MD;  Location: [facility];  Service: Urology;  Laterality: Left;  . EYE SURGERY     right eye  . HYSTERECTOMY ABDOMINAL WITH SALPINGECTOMY    . KIDNEY STONE SURGERY    . PERICARDIAL WINDOW    . WISDOM TOOTH EXTRACTION     Family History  Problem Relation Age of Onset  . Diabetes Mother   . Congestive Heart Failure Mother   . Prostate cancer Father   . Diabetes Sister   . High blood pressure Brother     Social History   Tobacco Use  . Smoking status: Never Smoker  . Smokeless tobacco: Never Used  Substance Use Topics  . Alcohol use: No   Marital Status: Married  ROS  Review of Systems  Constitutional: Negative for malaise/fatigue and weight gain.  Cardiovascular: Negative for chest pain, claudication, dyspnea on exertion, leg swelling, near-syncope, orthopnea, palpitations, paroxysmal nocturnal dyspnea and syncope.  Respiratory: Negative for shortness of breath.   Hematologic/Lymphatic: Does not bruise/bleed easily.  Gastrointestinal: Negative for melena.  Neurological: Negative for dizziness and weakness.   Objective    Blood pressure (!) 157/82, pulse 84, temperature 98.4 F (36.9 C), resp. rate 16, height 5' 6" (1.676 m), weight 149 lb (67.6 kg), SpO2 100 %.  Vitals with BMI 05/04/2020 05/04/2020 05/01/2020  Height - 5' 6" -  Weight - 149 lbs -  BMI - 24.06 -  Systolic 157 181 136  Diastolic 82 85 74  Pulse 84 87 79     Physical Exam Constitutional:       General: She is not in acute distress.    Appearance: She is normal weight.  Cardiovascular:     Rate and Rhythm: Normal rate and regular rhythm.     Pulses: Normal pulses and intact distal pulses.          Carotid pulses are on the right side with bruit.    Heart sounds: Normal heart sounds. No murmur heard. No gallop.      Comments: No leg edema, no JVD. Prominent abdominal bruit Pulmonary:     Effort: Pulmonary effort is normal.     Breath sounds: Normal breath sounds.  Abdominal:     General: Bowel sounds are normal.     Palpations: Abdomen is soft.  Musculoskeletal:     Cervical back: Normal range of motion.  Skin:    General: Skin is warm and dry.  Neurological:     Mental Status: She is oriented to person, place, and time.    Laboratory examination:   Recent Labs    10/04/19 0808 11/01/19 0800 11/24/19 1424 11/29/19 0812 04/19/20 0402 04/20/20 0556 05/01/20 0811  NA 137 136 136   < > 137 134* 138  K 3.5 4.5 4.1   < > 4.4 4.4 4.3  CL 98 99 95*   < > 98 98 99  CO2 24 24 27   < > 27 24 29  GLUCOSE 250* 195* 264*   < > 237* 135* 125*  BUN 112* 93* 94*   < > 90* 80* 84*  CREATININE 4.85* 5.07* 4.14*   < > 4.25* 3.70* 4.26*  CALCIUM 10.0 9.4 9.9   < > 9.1 8.7* 8.7*  GFRNONAA 10* 9* 12*   < > 12* 14* 12*  GFRAA 11* 11* 14*  --   --   --   --    < > = values in this interval not displayed.   estimated creatinine clearance is 14.8 mL/min (A) (by C-G formula based on SCr of 4.26 mg/dL (H)).  CMP Latest Ref Rng & Units 05/01/2020 04/20/2020 04/19/2020  Glucose 70 - 99 mg/dL 125(H) 135(H) 237(H)  BUN 6 - 20 mg/dL 84(H) 80(H) 90(H)  Creatinine 0.44 - 1.00 mg/dL 4.26(H) 3.70(H) 4.25(H)  Sodium 135 - 145 mmol/L 138 134(L) 137  Potassium 3.5 - 5.1 mmol/L 4.3 4.4 4.4  Chloride 98 - 111 mmol/L 99 98 98  CO2 22 - 32 mmol/L 29 24 27  Calcium 8.9 - 10.3 mg/dL 8.7(L) 8.7(L) 9.1  Total Protein 6.5 - 8.1 g/dL - - -  Total Bilirubin 0.3 - 1.2 mg/dL - - -  Alkaline Phos 38 -  126 U/L - - -  AST 15 - 41 U/L - - -  ALT 14 - 54 U/L - - -   CBC Latest Ref Rng & Units 05/01/2020 04/15/2020 04/14/2020  WBC 4.0 - 10.5 K/uL - 7.0 7.5  Hemoglobin 12.0 - 15.0 g/dL 9.5(L) 11.3(L) 11.0(L)  Hematocrit 36.0 - 46.0 % - 35.5(L) 33.3(L)  Platelets 150 - 400 K/uL - 339 332      Lipid Panel Recent Labs    04/02/20 0836  CHOL 164  TRIG 92  LDLCALC 89  HDL 58    HEMOGLOBIN A1C Lab Results  Component Value Date   HGBA1C 12.4 (H) 04/16/2020   MPG 309 04/16/2020   TSH Recent Labs    04/02/20 0836  TSH 1.400    External labs:   Labs 03/09/2020:  Serum creatinine 4.6, EGFR 10 mL. O+. A1c 9.7%. CHOL 104 12/08/2018  TRIG 85 12/08/2018  HDL 44 12/08/2018  LDL 43 12/08/2018    Medications and allergies   Allergies  Allergen Reactions  . Cephalexin Diarrhea     Outpatient Medications Prior to Visit  Medication Sig Dispense Refill  . acetaminophen (TYLENOL) 500 MG tablet Take 500 mg by mouth every 6 (six) hours as needed for moderate pain.    . aspirin 81 MG EC tablet Take 81 mg by mouth at bedtime.     . chlorthalidone (HYGROTON) 25 MG tablet Take 25 mg by mouth daily.    . ciclopirox (PENLAC) 8 % solution Apply 1 application topically 2 (two) times daily.    . epoetin alfa (EPOGEN) 2000 UNIT/ML injection Inject 2,000 Units into the skin every 28 (twenty-eight) days.    . Insulin Aspart, w/Niacinamide, (FIASP PENFILL) 100 UNIT/ML SOCT Inject 3 Units into the skin See admin instructions. Before each meal (Patient taking differently: Inject 6 Units into the skin See admin instructions. Before each meal)    . Insulin Degludec (TRESIBA) 100 UNIT/ML SOLN Inject 22 Units into the skin at bedtime. (Patient taking differently: Inject 18 Units into the skin at bedtime.)    . Magnesium Oxide 420 MG TABS Take 420 mg by mouth daily.    . metoprolol succinate (TOPROL-XL) 50 MG 24 hr tablet Take 50 mg by mouth 2 (two) times daily.    . Multiple Vitamin (MULTIVITAMIN WITH  MINERALS) TABS tablet Take 1 tablet by mouth daily.    . NIFEdipine (ADALAT CC) 90 MG 24 hr tablet Take 90 mg by mouth daily.    . rosuvastatin (CRESTOR) 20 MG tablet Take 20 mg by mouth every evening.    . clotrimazole-betamethasone (LOTRISONE) cream Apply 1 application topically 2 (two) times daily.     No facility-administered medications prior to visit.    Radiology:   XR CHEST PA AND LATERAL 1. Normal heart size. Questionable enlargement of the main pulmonary artery. 2. Clear lungs. 3. No pleural abnormality.   Cardiac Studies:   PCV ECHOCARDIOGRAM COMPLETE 04/11/2020 Normal LV systolic function with visual EF 55-60%. Left ventricle cavity is normal in size. Mild left ventricular hypertrophy. Normal global wall motion. Normal diastolic filling pattern, normal LAP. Mild (Grade I) mitral regurgitation. Mild to moderate tricuspid regurgitation. No evidence of pulmonary hypertension. No prior study for comparison.  Carotid artery duplex 04/11/2020:  No hemodynamically significant arterial disease in the internal carotid  artery bilaterally.  There is mild heterogeneous plaque noted in bilateral carotid arteries.  Antegrade right vertebral artery flow. Antegrade left vertebral artery  Flow.  Renal artery duplex 04/11/2020:  Hemodynamically significant stenosis of the right renal artery suggestive  of >50% stenosis.  Renal length is within normal limits for both kidneys.  Normal abdominal aorta flow velocities noted. NO significant plaque noted  in the abdominal aorta.    PCV MYOCARDIAL PERFUSION WO LEXISCAN 03/26/2020 Lexiscan/modified Bruce nuclear stress test performed using 1-day protocol. Patient achieved 2.3 mets and reached 78% MPHR. Stress EKG is non-diagnostic, as this is pharmacological stress   test. In addition, stress EKG at 78% MPHR showed nonspecific ST-T changes in inferior leads. Normal myocardial perfusion. Stress LVEF 61%. Low risk study.  EKG:   EKG  03/22/2020: Normal sinus rhythm at rate of 91 bpm, left atrial enlargement, normal axis.  Nonspecific T abnormality.      Assessment     ICD-10-CM   1. Right renal artery stenosis (HCC)  I70.1 CBC    Basic metabolic panel    Novel Coronavirus, NAA ([facility])  2. Primary hypertension  I10 CANCELED: EKG 12-Lead  3. Hypercholesteremia  E78.00   4. Controlled type 2 diabetes mellitus with complication, with long-term current use of insulin (HCC)  E11.8    Z79.4   5. Pre-operative cardiovascular examination, high risk surgery  Z01.810   6. Kidney transplant candidate  Z76.82      Medications Discontinued During This Encounter  Medication Reason  . clotrimazole-betamethasone (LOTRISONE) cream Error    Meds ordered this encounter  Medications  . hydrALAZINE (APRESOLINE) 25 MG tablet    Sig: Take 1 tablet (25 mg total) by mouth 3 (three) times daily.    Dispense:  270 tablet    Refill:  3   Orders Placed This Encounter  Procedures  . Novel Coronavirus, NAA ([facility])    Order Specific Question:   Is this test for diagnosis or screening    Answer:   Screening    Order Specific Question:   Symptomatic for COVID-19 as defined by CDC    Answer:   No    Order Specific Question:   Hospitalized for COVID-19    Answer:   No    Order Specific Question:   Admitted to ICU for COVID-19    Answer:   No    Order Specific Question:   Previously tested for COVID-19    Answer:   Yes    Order Specific Question:   Resident in a congregate (group) care setting    Answer:   No    Order Specific Question:   Is the patient student?    Answer:   No    Order Specific Question:   Employed in healthcare setting    Answer:   No    Order Specific Question:   Has patient completed COVID vaccination(s) (2 doses of Pfizer/Moderna 1 dose of [employer])    Answer:   Yes    Order Specific Question:   Has patient completed COVID Booster / 3rd dose    Answer:   Yes    Order Specific Question:   Pregnant     Answer:   No  . CBC  . Basic metabolic panel    Recommendations:   Madaline Lynele Cudney is a 50 y.o. African-American female with extensive medical history, pertinent to cardiac include hypertension, hyperlipidemia, diabetes mellitus with diabetic retinopathy and diabetic chronic kidney disease, history of stroke in 2016 with left-sided weakness has mostly resolved, pericardial effusion and history of pericardial window 06/21/2018 referred to me for pretransplant cardiac work-up.  Patient has remained stable with chronic stage IV kidney disease and has been screened at [facility] for kidney transplant.    Patient presents for 6-week follow-up of hypertension and results of cardiac testing for preoperative evaluation.  Since last visit patient has undergone nuclear stress test which was low risk, carotid artery duplex which showed minimal plaque bilaterally, echocardiogram with normal LVEF, and renal artery duplex that revealed right renal artery stenosis.  Patient's lipids are well controlled   and TSH is normal.  Discussed results of renal artery duplex and renal artery stenosis, and reviewed management options with the patient including proceeding with angiography in the hopes that if stenosis is amenable to intervention will improve both renal function and blood pressure control.  Discussed at length with patient regarding risks versus benefits as well as potential complications of procedure, she verbalized understanding and wishes to proceed with angiography.  We will schedule her for CO2 angiography in view of chronic kidney disease.  In regard to hypertension, he remains uncontrolled.  We will add hydralazine 25 mg 3 times daily.  Encourage patient to monitor blood pressure on a daily basis and notify our office if it remains elevated above goal.  We will reevaluate preoperative recommendations for kidney transplant following renal angiography.  Follow-up in approximately 4  weeks following procedure.  Patient was seen in collaboration with Dr. Ganji and he is in agreement with the plan.   Gicela Schwarting C Aleena Kirkeby, PA-C 05/04/2020, 1:01 PM Office: [phone]  

## 2020-05-04 ENCOUNTER — Encounter: Payer: Self-pay | Admitting: Student

## 2020-05-04 ENCOUNTER — Other Ambulatory Visit: Payer: Self-pay

## 2020-05-04 ENCOUNTER — Ambulatory Visit: Payer: BC Managed Care – PPO | Admitting: Student

## 2020-05-04 VITALS — BP 157/82 | HR 84 | Temp 98.4°F | Resp 16 | Ht 66.0 in | Wt 149.0 lb

## 2020-05-04 DIAGNOSIS — E7800 Pure hypercholesterolemia, unspecified: Secondary | ICD-10-CM

## 2020-05-04 DIAGNOSIS — Z794 Long term (current) use of insulin: Secondary | ICD-10-CM

## 2020-05-04 DIAGNOSIS — I10 Essential (primary) hypertension: Secondary | ICD-10-CM

## 2020-05-04 DIAGNOSIS — Z7682 Awaiting organ transplant status: Secondary | ICD-10-CM

## 2020-05-04 DIAGNOSIS — Z01810 Encounter for preprocedural cardiovascular examination: Secondary | ICD-10-CM

## 2020-05-04 DIAGNOSIS — I701 Atherosclerosis of renal artery: Secondary | ICD-10-CM

## 2020-05-04 MED ORDER — HYDRALAZINE HCL 25 MG PO TABS: 25.0000 mg | ORAL_TABLET | Freq: Three times a day (TID) | ORAL | 3 refills | Status: AC

## 2020-05-08 ENCOUNTER — Other Ambulatory Visit: Payer: Self-pay | Admitting: Student

## 2020-05-09 LAB — BASIC METABOLIC PANEL
BUN/Creatinine Ratio: 17 (ref 9–23)
BUN: 73 mg/dL — ABNORMAL HIGH (ref 6–24)
CO2: 23 mmol/L (ref 20–29)
Calcium: 9 mg/dL (ref 8.7–10.2)
Chloride: 97 mmol/L (ref 96–106)
Creatinine, Ser: 4.38 mg/dL — ABNORMAL HIGH (ref 0.57–1.00)
Glucose: 104 mg/dL — ABNORMAL HIGH (ref 65–99)
Potassium: 4.9 mmol/L (ref 3.5–5.2)
Sodium: 139 mmol/L (ref 134–144)
eGFR: 12 mL/min/{1.73_m2} — ABNORMAL LOW (ref >59)

## 2020-05-09 LAB — CBC
Hematocrit: 31.7 % — ABNORMAL LOW (ref 34.0–46.6)
Hemoglobin: 10.2 g/dL — ABNORMAL LOW (ref 11.1–15.9)
MCH: 28.8 pg (ref 26.6–33.0)
MCHC: 32.2 g/dL (ref 31.5–35.7)
MCV: 90 fL (ref 79–97)
Platelets: 428 10*3/uL (ref 150–450)
RBC: 3.54 x10E6/uL — ABNORMAL LOW (ref 3.77–5.28)
RDW: 13.7 % (ref 11.7–15.4)
WBC: 6.8 10*3/uL (ref 3.4–10.8)

## 2020-05-18 ENCOUNTER — Other Ambulatory Visit (HOSPITAL_COMMUNITY)
Admission: RE | Admit: 2020-05-18 | Discharge: 2020-05-18 | Disposition: A | Discharge status: Home / Self Care | Payer: BC Managed Care – PPO | Source: Ambulatory Visit | Attending: Cardiology | Admitting: Cardiology

## 2020-05-18 DIAGNOSIS — Z20822 Contact with and (suspected) exposure to covid-19: Secondary | ICD-10-CM | POA: Insufficient documentation

## 2020-05-18 DIAGNOSIS — Z01812 Encounter for preprocedural laboratory examination: Secondary | ICD-10-CM | POA: Insufficient documentation

## 2020-05-18 LAB — SARS CORONAVIRUS 2 (TAT 6-24 HRS): SARS Coronavirus 2: NEGATIVE

## 2020-05-21 DIAGNOSIS — I701 Atherosclerosis of renal artery: Secondary | ICD-10-CM | POA: Diagnosis present

## 2020-05-21 DIAGNOSIS — N184 Chronic kidney disease, stage 4 (severe): Secondary | ICD-10-CM | POA: Diagnosis present

## 2020-05-22 ENCOUNTER — Encounter (HOSPITAL_COMMUNITY)
Admission: RE | Disposition: A | Discharge status: Home / Self Care | Payer: Self-pay | Source: Home / Self Care | Attending: Cardiology

## 2020-05-22 ENCOUNTER — Encounter (HOSPITAL_COMMUNITY): Payer: Self-pay | Admitting: Cardiology

## 2020-05-22 ENCOUNTER — Ambulatory Visit (HOSPITAL_COMMUNITY)
Admission: RE | Admit: 2020-05-22 | Discharge: 2020-05-22 | Disposition: A | Discharge status: Home / Self Care | Payer: BC Managed Care – PPO | Attending: Cardiology | Admitting: Cardiology

## 2020-05-22 ENCOUNTER — Other Ambulatory Visit: Payer: Self-pay

## 2020-05-22 DIAGNOSIS — Z8249 Family history of ischemic heart disease and other diseases of the circulatory system: Secondary | ICD-10-CM | POA: Diagnosis not present

## 2020-05-22 DIAGNOSIS — I701 Atherosclerosis of renal artery: Secondary | ICD-10-CM | POA: Diagnosis present

## 2020-05-22 DIAGNOSIS — I69398 Other sequelae of cerebral infarction: Secondary | ICD-10-CM | POA: Diagnosis not present

## 2020-05-22 DIAGNOSIS — Z90710 Acquired absence of both cervix and uterus: Secondary | ICD-10-CM | POA: Insufficient documentation

## 2020-05-22 DIAGNOSIS — I129 Hypertensive chronic kidney disease with stage 1 through stage 4 chronic kidney disease, or unspecified chronic kidney disease: Secondary | ICD-10-CM | POA: Diagnosis not present

## 2020-05-22 DIAGNOSIS — E1122 Type 2 diabetes mellitus with diabetic chronic kidney disease: Secondary | ICD-10-CM | POA: Diagnosis not present

## 2020-05-22 DIAGNOSIS — Z79899 Other long term (current) drug therapy: Secondary | ICD-10-CM | POA: Diagnosis not present

## 2020-05-22 DIAGNOSIS — Z833 Family history of diabetes mellitus: Secondary | ICD-10-CM | POA: Diagnosis not present

## 2020-05-22 DIAGNOSIS — Z794 Long term (current) use of insulin: Secondary | ICD-10-CM | POA: Insufficient documentation

## 2020-05-22 DIAGNOSIS — Z7682 Awaiting organ transplant status: Secondary | ICD-10-CM | POA: Diagnosis not present

## 2020-05-22 DIAGNOSIS — Z960 Presence of urogenital implants: Secondary | ICD-10-CM | POA: Insufficient documentation

## 2020-05-22 DIAGNOSIS — I69354 Hemiplegia and hemiparesis following cerebral infarction affecting left non-dominant side: Secondary | ICD-10-CM | POA: Diagnosis not present

## 2020-05-22 DIAGNOSIS — Z7982 Long term (current) use of aspirin: Secondary | ICD-10-CM | POA: Insufficient documentation

## 2020-05-22 DIAGNOSIS — N184 Chronic kidney disease, stage 4 (severe): Secondary | ICD-10-CM | POA: Insufficient documentation

## 2020-05-22 DIAGNOSIS — E7800 Pure hypercholesterolemia, unspecified: Secondary | ICD-10-CM | POA: Insufficient documentation

## 2020-05-22 DIAGNOSIS — I69311 Memory deficit following cerebral infarction: Secondary | ICD-10-CM | POA: Diagnosis not present

## 2020-05-22 DIAGNOSIS — I10 Essential (primary) hypertension: Secondary | ICD-10-CM | POA: Diagnosis present

## 2020-05-22 HISTORY — PX: RENAL ANGIOGRAPHY: CATH118260

## 2020-05-22 LAB — GLUCOSE, CAPILLARY: Glucose-Capillary: 127 mg/dL — ABNORMAL HIGH (ref 70–99)

## 2020-05-22 SURGERY — RENAL ANGIOGRAPHY
Anesthesia: LOCAL | Laterality: Bilateral

## 2020-05-22 MED ORDER — ONDANSETRON HCL 4 MG/2ML IJ SOLN: 4.0000 mg | Freq: Four times a day (QID) | INTRAMUSCULAR | Status: DC | PRN

## 2020-05-22 MED ORDER — SODIUM CHLORIDE 0.9 % IV SOLN: INTRAVENOUS | Status: DC

## 2020-05-22 MED ORDER — SODIUM CHLORIDE 0.9% FLUSH: 3.0000 mL | Freq: Two times a day (BID) | INTRAVENOUS | Status: DC

## 2020-05-22 MED ORDER — SODIUM CHLORIDE 0.9% FLUSH: 3.0000 mL | INTRAVENOUS | Status: DC | PRN

## 2020-05-22 MED ORDER — SODIUM CHLORIDE 0.9 % IV SOLN: 250.0000 mL | INTRAVENOUS | Status: DC | PRN

## 2020-05-22 MED ORDER — HEPARIN (PORCINE) IN NACL 1000-0.9 UT/500ML-% IV SOLN
INTRAVENOUS | Status: DC | PRN
  Administered 2020-05-22 (×2): 500 mL

## 2020-05-22 MED ORDER — LIDOCAINE HCL (PF) 1 % IJ SOLN
INTRAMUSCULAR | Status: DC | PRN
  Administered 2020-05-22: 15 mL

## 2020-05-22 MED ORDER — FENTANYL CITRATE (PF) 100 MCG/2ML IJ SOLN
INTRAMUSCULAR | Status: DC | PRN
  Administered 2020-05-22: 25 ug via INTRAVENOUS

## 2020-05-22 MED ORDER — HEPARIN (PORCINE) IN NACL 1000-0.9 UT/500ML-% IV SOLN
INTRAVENOUS | Status: AC
  Filled 2020-05-22: qty 500

## 2020-05-22 MED ORDER — SODIUM CHLORIDE 0.9 % IV BOLUS
500.0000 mL | Freq: Once | INTRAVENOUS | Status: AC
  Administered 2020-05-22: 500 mL via INTRAVENOUS

## 2020-05-22 MED ORDER — FENTANYL CITRATE (PF) 100 MCG/2ML IJ SOLN
INTRAMUSCULAR | Status: AC
  Filled 2020-05-22: qty 2

## 2020-05-22 MED ORDER — MIDAZOLAM HCL 2 MG/2ML IJ SOLN
INTRAMUSCULAR | Status: AC
  Filled 2020-05-22: qty 2

## 2020-05-22 MED ORDER — MIDAZOLAM HCL 2 MG/2ML IJ SOLN
INTRAMUSCULAR | Status: DC | PRN
  Administered 2020-05-22: 1 mg via INTRAVENOUS

## 2020-05-22 MED ORDER — LIDOCAINE HCL (PF) 1 % IJ SOLN
INTRAMUSCULAR | Status: AC
  Filled 2020-05-22: qty 30

## 2020-05-22 MED ORDER — ACETAMINOPHEN 325 MG PO TABS: 650.0000 mg | ORAL_TABLET | ORAL | Status: DC | PRN

## 2020-05-22 SURGICAL SUPPLY — 15 items
CATH CROSS OVER TEMPO 5F (CATHETERS) ×2 IMPLANT
CATH OMNI FLUSH 5F 65CM (CATHETERS) ×2 IMPLANT
CLOSURE MYNX CONTROL 6F/7F (Vascular Products) ×2 IMPLANT
FILTER CO2 0.2 MICRON (VASCULAR PRODUCTS) ×2 IMPLANT
KIT MICROPUNCTURE NIT STIFF (SHEATH) ×2 IMPLANT
KIT PV (KITS) ×2 IMPLANT
RESERVOIR CO2 (VASCULAR PRODUCTS) ×2 IMPLANT
SET FLUSH CO2 (MISCELLANEOUS) ×2 IMPLANT
SHEATH PINNACLE 6F 10CM (SHEATH) ×2 IMPLANT
SHEATH PROBE COVER 6X72 (BAG) ×2 IMPLANT
STOPCOCK MORSE 400PSI 3WAY (MISCELLANEOUS) ×2 IMPLANT
SYR MEDRAD MARK 7 150ML (SYRINGE) ×2 IMPLANT
TRANSDUCER W/STOPCOCK (MISCELLANEOUS) ×2 IMPLANT
TRAY PV CATH (CUSTOM PROCEDURE TRAY) ×2 IMPLANT
WIRE BENTSON .035X145CM (WIRE) ×2 IMPLANT

## 2020-05-22 NOTE — Discharge Instructions (Signed)
Femoral Site Care  This sheet gives you information about how to care for yourself after your procedure. Your health care provider may also give you more specific instructions. If you have problems or questions, contact your health care provider. What can I expect after the procedure? After the procedure, it is common to have:  Bruising that usually fades within 1-2 weeks.  Tenderness at the site. Follow these instructions at home: Wound care  Follow instructions from your health care provider about how to take care of your insertion site. Make sure you: ? Wash your hands with soap and water before you change your bandage (dressing). If soap and water are not available, use hand sanitizer. ? Change your dressing as told by your health care provider. ? Leave stitches (sutures), skin glue, or adhesive strips in place. These skin closures may need to stay in place for 2 weeks or longer. If adhesive strip edges start to loosen and curl up, you may trim the loose edges. Do not remove adhesive strips completely unless your health care provider tells you to do that.  Do not take baths, swim, or use a hot tub until your health care provider approves.  You may shower 24-48 hours after the procedure or as told by your health care provider. ? Gently wash the site with plain soap and water. ? Pat the area dry with a clean towel. ? Do not rub the site. This may cause bleeding.  Do not apply powder or lotion to the site. Keep the site clean and dry.  Check your femoral site every day for signs of infection. Check for: ? Redness, swelling, or pain. ? Fluid or blood. ? Warmth. ? Pus or a bad smell. Activity  For the first 2-3 days after your procedure, or as long as directed: ? Avoid climbing stairs as much as possible. ? Do not squat.  Do not lift anything that is heavier than 10 lb (4.5 kg), or the limit that you are told, until your health care provider says that it is safe.  Rest as  directed. ? Avoid sitting for a long time without moving. Get up to take short walks every 1-2 hours.  Do not drive for 24 hours if you were given a medicine to help you relax (sedative). General instructions  Take over-the-counter and prescription medicines only as told by your health care provider.  Keep all follow-up visits as told by your health care provider. This is important. Contact a health care provider if you have:  A fever or chills.  You have redness, swelling, or pain around your insertion site. Get help right away if:  The catheter insertion area swells very fast.  You pass out.  You suddenly start to sweat or your skin gets clammy.  The catheter insertion area is bleeding, and the bleeding does not stop when you hold steady pressure on the area.  The area near or just beyond the catheter insertion site becomes pale, cool, tingly, or numb. These symptoms may represent a serious problem that is an emergency. Do not wait to see if the symptoms will go away. Get medical help right away. Call your local emergency services (911 in the [place] Do not drive yourself to the hospital. Summary  After the procedure, it is common to have bruising that usually fades within 1-2 weeks.  Check your femoral site every day for signs of infection.  Do not lift anything that is heavier than 10 lb (4.5 kg), or   the limit that you are told, until your health care provider says that it is safe. This information is not intended to replace advice given to you by your health care provider. Make sure you discuss any questions you have with your health care provider. Document Revised: 10/14/2019 Document Reviewed: 10/14/2019 Elsevier Patient Education  2021 [employer]  

## 2020-05-22 NOTE — Progress Notes (Signed)
Discharge instructions reviewed with pt and her husband (via telephone) both voice understanding.  

## 2020-05-22 NOTE — Interval H&P Note (Signed)
History and Physical Interval Note:  05/22/2020 7:49 AM  Felicia Guerrero  has presented today for surgery, with the diagnosis of hp - pre op for kidney transplant.  The various methods of treatment have been discussed with the patient and family. After consideration of risks, benefits and other options for treatment, the patient has consented to  Procedure(s): RENAL ANGIOGRAPHY (N/A) and possible angioplasty as a surgical intervention.  The patient's history has been reviewed, patient examined, no change in status, stable for surgery.  I have reviewed the patient's chart and labs.  Questions were answered to the patient's satisfaction.     Adrian Prows

## 2020-05-22 NOTE — Progress Notes (Signed)
Ambulated in hallway tol well no bleeding noted before or after ambulation  

## 2020-05-23 ENCOUNTER — Encounter (HOSPITAL_COMMUNITY): Payer: Self-pay | Admitting: Cardiology

## 2020-05-29 ENCOUNTER — Ambulatory Visit (HOSPITAL_COMMUNITY)
Admission: RE | Admit: 2020-05-29 | Discharge: 2020-05-29 | Disposition: A | Discharge status: Home / Self Care | Payer: BC Managed Care – PPO | Source: Ambulatory Visit | Attending: Nephrology | Admitting: Nephrology

## 2020-05-29 ENCOUNTER — Other Ambulatory Visit: Payer: Self-pay

## 2020-05-29 VITALS — BP 119/63 | HR 102 | Temp 98.1°F | Resp 18

## 2020-05-29 DIAGNOSIS — N289 Disorder of kidney and ureter, unspecified: Secondary | ICD-10-CM | POA: Insufficient documentation

## 2020-05-29 LAB — RENAL FUNCTION PANEL
Albumin: 3.7 g/dL (ref 3.5–5.0)
Anion gap: 10 (ref 5–15)
BUN: 88 mg/dL — ABNORMAL HIGH (ref 6–20)
CO2: 25 mmol/L (ref 22–32)
Calcium: 8.7 mg/dL — ABNORMAL LOW (ref 8.9–10.3)
Chloride: 98 mmol/L (ref 98–111)
Creatinine, Ser: 4.21 mg/dL — ABNORMAL HIGH (ref 0.44–1.00)
GFR, Estimated: 12 mL/min — ABNORMAL LOW (ref >60)
Glucose, Bld: 229 mg/dL — ABNORMAL HIGH (ref 70–99)
Phosphorus: 5.5 mg/dL — ABNORMAL HIGH (ref 2.5–4.6)
Potassium: 4.8 mmol/L (ref 3.5–5.1)
Sodium: 133 mmol/L — ABNORMAL LOW (ref 135–145)

## 2020-05-29 LAB — IRON AND TIBC
Iron: 29 ug/dL (ref 28–170)
Saturation Ratios: 10 % — ABNORMAL LOW (ref 10.4–31.8)
TIBC: 288 ug/dL (ref 250–450)
UIBC: 259 ug/dL

## 2020-05-29 LAB — FERRITIN: Ferritin: 519 ng/mL — ABNORMAL HIGH (ref 11–307)

## 2020-05-29 LAB — POCT HEMOGLOBIN-HEMACUE: Hemoglobin: 8.8 g/dL — ABNORMAL LOW (ref 12.0–15.0)

## 2020-05-29 MED ORDER — EPOETIN ALFA-EPBX 10000 UNIT/ML IJ SOLN
20000.0000 [IU] | INTRAMUSCULAR | Status: DC
  Administered 2020-05-29: 20000 [IU] via SUBCUTANEOUS

## 2020-05-29 MED ORDER — EPOETIN ALFA-EPBX 10000 UNIT/ML IJ SOLN
INTRAMUSCULAR | Status: AC
  Filled 2020-05-29: qty 2

## 2020-05-29 NOTE — Progress Notes (Signed)
Primary Physician/Referring:  Bartholome Bill, MD  Patient ID: Felicia Guerrero, female    DOB: 01-18-1971, 50 y.o.   MRN: [id number]  Chief Complaint  Patient presents with  . Right renal artery stenosis   . Follow-up  . Hospitalization Follow-up   HPI:    Felicia Guerrero  is a 50 y.o. African-American female with extensive medical history, pertinent to cardiac include hypertension, hyperlipidemia, diabetes mellitus with diabetic retinopathy and diabetic chronic kidney disease, history of stroke in 2016 with left-sided weakness has mostly resolved, pericardial effusion and history of pericardial window 06/21/2018 referred to me for pretransplant cardiac work-up. She is asymptomatic.   Patient presents for 4-week follow-up after renal angiography which revealed widely patent bilateral renal arteries, no evidence of renal artery stenosis.  Patient is tolerating hydralazine well without issue.  Patient expresses frustration at the discrepancy between renal ultrasound and renal angiogram.  Patient denies chest pain, palpitations, dyspnea, syncope, near syncope.  Denies symptoms suggestive of TIA/CVA.  Past Medical History:  Diagnosis Date  . Anemia    hx of  . Arthritis    right pinky osteoarthritis  . Chronic kidney disease    stage 3 ckd  . Depression    hx of  . Diabetes mellitus without complication ([place])    type 2  . Heart murmur   . History of kidney stones   . Hypertension   . Pneumothorax   . Stroke [facility])    short term memory loss and left side weakness /blindness right eye   Nov 28 2014   Past Surgical History:  Procedure Laterality Date  . CARDIAC SURGERY     For pericardial effusion  . CESAREAN SECTION    . CHEST TUBE INSERTION    . CYSTOSCOPY W/ URETERAL STENT PLACEMENT Left 02/15/2017   Procedure: CYSTOSCOPY WITH LEFT URETERAL STENT PLACEMENT;  Surgeon: Raynelle Bring, MD;  Location: [facility];  Service: Urology;  Laterality: Left;  .  CYSTOSCOPY/URETEROSCOPY/HOLMIUM LASER/STENT PLACEMENT Left 03/12/2017   Procedure: CYSTOSCOPY/URETEROSCOPY/HOLMIUM LASER/STENT PLACEMENT;  Surgeon: Raynelle Bring, MD;  Location: [facility];  Service: Urology;  Laterality: Left;  . CYSTOSCOPY/URETEROSCOPY/HOLMIUM LASER/STENT PLACEMENT Left 11/28/2019   Procedure: CYSTOSCOPY/RETROGRADE/URETEROSCOPY;  Surgeon: Raynelle Bring, MD;  Location: [facility];  Service: Urology;  Laterality: Left;  . EYE SURGERY     right eye  . HYSTERECTOMY ABDOMINAL WITH SALPINGECTOMY    . KIDNEY STONE SURGERY    . PERICARDIAL WINDOW    . RENAL ANGIOGRAPHY Bilateral 05/22/2020   Procedure: RENAL ANGIOGRAPHY;  Surgeon: Adrian Prows, MD;  Location: [place] [facility];  Service: Cardiovascular;  Laterality: Bilateral;  . WISDOM TOOTH EXTRACTION     Family History  Problem Relation Age of Onset  . Diabetes Mother   . Congestive Heart Failure Mother   . Prostate cancer Father   . Diabetes Sister   . High blood pressure Brother     Social History   Tobacco Use  . Smoking status: Never Smoker  . Smokeless tobacco: Never Used  Substance Use Topics  . Alcohol use: No   Marital Status: Married  ROS  Review of Systems  Constitutional: Negative for malaise/fatigue and weight gain.  Cardiovascular: Negative for chest pain, claudication, dyspnea on exertion, leg swelling, near-syncope, orthopnea, palpitations, paroxysmal nocturnal dyspnea and syncope.  Respiratory: Negative for shortness of breath.   Hematologic/Lymphatic: Does not bruise/bleed easily.  Gastrointestinal: Negative for melena.  Neurological: Negative for dizziness and weakness.   Objective  Blood pressure 139/71, pulse  83, temperature 98.3 F (36.8 C), height 5' 6"  (1.676 m), weight 158 lb (71.7 kg), SpO2 99 %.  Vitals with BMI 05/30/2020 05/29/2020 05/22/2020  Height 5' 6"  - -  Weight 158 lbs - -  BMI 03.47 - -  Systolic 425 956 387  Diastolic 71 63 62  Pulse 83 102 70     Physical Exam Constitutional:       General: She is not in acute distress.    Appearance: She is normal weight.  Cardiovascular:     Rate and Rhythm: Normal rate and regular rhythm.     Pulses: Normal pulses and intact distal pulses.          Carotid pulses are on the right side with bruit.    Heart sounds: Normal heart sounds. No murmur heard. No gallop.      Comments: No leg edema, no JVD. Prominent abdominal bruit Pulmonary:     Effort: Pulmonary effort is normal.     Breath sounds: Normal breath sounds.  Musculoskeletal:     Cervical back: Normal range of motion.     Right lower leg: No edema.     Left lower leg: No edema.  Skin:    General: Skin is warm and dry.  Neurological:     Mental Status: She is oriented to person, place, and time.    Laboratory examination:   Recent Labs    10/04/19 0808 11/01/19 0800 11/24/19 1424 11/29/19 0812 04/20/20 0556 05/01/20 0811 05/08/20 0819 05/29/20 0901  NA 137 136 136   < > 134* 138 139 133*  K 3.5 4.5 4.1   < > 4.4 4.3 4.9 4.8  CL 98 99 95*   < > 98 99 97 98  CO2 24 24 27    < > 24 29 23 25   GLUCOSE 250* 195* 264*   < > 135* 125* 104* 229*  BUN 112* 93* 94*   < > 80* 84* 73* 88*  CREATININE 4.85* 5.07* 4.14*   < > 3.70* 4.26* 4.38* 4.21*  CALCIUM 10.0 9.4 9.9   < > 8.7* 8.7* 9.0 8.7*  GFRNONAA 10* 9* 12*   < > 14* 12*  --  12*  GFRAA 11* 11* 14*  --   --   --   --   --    < > = values in this interval not displayed.   estimated creatinine clearance is 16.2 mL/min (A) (by C-G formula based on SCr of 4.21 mg/dL (H)).  CMP Latest Ref Rng & Units 05/29/2020 05/08/2020 05/01/2020  Glucose 70 - 99 mg/dL 229(H) 104(H) 125(H)  BUN 6 - 20 mg/dL 88(H) 73(H) 84(H)  Creatinine 0.44 - 1.00 mg/dL 4.21(H) 4.38(H) 4.26(H)  Sodium 135 - 145 mmol/L 133(L) 139 138  Potassium 3.5 - 5.1 mmol/L 4.8 4.9 4.3  Chloride 98 - 111 mmol/L 98 97 99  CO2 22 - 32 mmol/L 25 23 29   Calcium 8.9 - 10.3 mg/dL 8.7(L) 9.0 8.7(L)  Total Protein 6.5 - 8.1 g/dL - - -  Total Bilirubin 0.3 - 1.2  mg/dL - - -  Alkaline Phos 38 - 126 U/L - - -  AST 15 - 41 U/L - - -  ALT 14 - 54 U/L - - -   CBC Latest Ref Rng & Units 05/29/2020 05/08/2020 05/01/2020  WBC 3.4 - 10.8 x10E3/uL - 6.8 -  Hemoglobin 12.0 - 15.0 g/dL 8.8(L) 10.2(L) 9.5(L)  Hematocrit 34.0 - 46.6 % - 31.7(L) -  Platelets  150 - 450 x10E3/uL - 428 -    Lipid Panel Recent Labs    04/02/20 0836  CHOL 164  TRIG 92  LDLCALC 89  HDL 58    HEMOGLOBIN A1C Lab Results  Component Value Date   HGBA1C 12.4 (H) 04/16/2020   MPG 309 04/16/2020   TSH Recent Labs    04/02/20 0836  TSH 1.400    External labs:   Labs 03/09/2020:  Serum creatinine 4.6, EGFR 10 mL. O+. A1c 9.7%. CHOL 104 12/08/2018  TRIG 85 12/08/2018  HDL 44 12/08/2018  LDL 43 12/08/2018    Medications and allergies   Allergies  Allergen Reactions  . Cephalexin Diarrhea     Outpatient Medications Prior to Visit  Medication Sig Dispense Refill  . acetaminophen (TYLENOL) 500 MG tablet Take 500 mg by mouth every 6 (six) hours as needed for moderate pain.    . chlorthalidone (HYGROTON) 25 MG tablet Take 25 mg by mouth daily.    . ciclopirox (PENLAC) 8 % solution Apply 1 application topically 2 (two) times daily.    . Continuous Blood Gluc Sensor (DEXCOM G6 SENSOR) MISC Apply topically.    Marland Kitchen epoetin alfa (EPOGEN) 2000 UNIT/ML injection Inject 2,000 Units into the skin every 28 (twenty-eight) days.    . hydrALAZINE (APRESOLINE) 25 MG tablet Take 1 tablet (25 mg total) by mouth 3 (three) times daily. 270 tablet 3  . Insulin Aspart, w/Niacinamide, (FIASP PENFILL) 100 UNIT/ML SOCT Inject 3 Units into the skin See admin instructions. Before each meal (Patient taking differently: Inject 6 Units into the skin 3 (three) times daily before meals.)    . Insulin Degludec (TRESIBA) 100 UNIT/ML SOLN Inject 22 Units into the skin at bedtime. (Patient taking differently: Inject 18 Units into the skin at bedtime.)    . Magnesium Oxide 420 MG TABS Take 420 mg by mouth  daily.    . metoprolol succinate (TOPROL-XL) 50 MG 24 hr tablet Take 50 mg by mouth 2 (two) times daily.    . Multiple Vitamin (MULTIVITAMIN WITH MINERALS) TABS tablet Take 1 tablet by mouth daily.    Marland Kitchen NIFEdipine (ADALAT CC) 90 MG 24 hr tablet Take 90 mg by mouth daily.    . rosuvastatin (CRESTOR) 20 MG tablet Take 20 mg by mouth every evening.     No facility-administered medications prior to visit.    Radiology:   XR CHEST PA AND LATERAL 1. Normal heart size. Questionable enlargement of the main pulmonary artery. 2. Clear lungs. 3. No pleural abnormality.   Cardiac Studies:  Abdominal aortogram and bilateral selective renal arteriogram 05/22/2020: Normal abdominal aorta.  2 renal arteries 1 on either sides.  Selective renal angiogram reveals widely patent bilateral renal arteries.  No evidence of renal artery stenosis.  PCV ECHOCARDIOGRAM COMPLETE 95/62/1308 Normal LV systolic function with visual EF 55-60%. Left ventricle cavity is normal in size. Mild left ventricular hypertrophy. Normal global wall motion. Normal diastolic filling pattern, normal LAP. Mild (Grade I) mitral regurgitation. Mild to moderate tricuspid regurgitation. No evidence of pulmonary hypertension. No prior study for comparison.  Carotid artery duplex 04/11/2020:  No hemodynamically significant arterial disease in the internal carotid  artery bilaterally.  There is mild heterogeneous plaque noted in bilateral carotid arteries.  Antegrade right vertebral artery flow. Antegrade left vertebral artery  Flow.  Renal artery duplex 04/11/2020:  Hemodynamically significant stenosis of the right renal artery suggestive  of >50% stenosis.  Renal length is within normal limits for both kidneys.  Normal abdominal aorta flow velocities noted. NO significant plaque noted  in the abdominal aorta.  PCV MYOCARDIAL PERFUSION WO LEXISCAN 03/26/2020 Lexiscan/modified Bruce nuclear stress test performed using 1-day  protocol. Patient achieved 2.3 mets and reached 78% MPHR. Stress EKG is non-diagnostic, as this is pharmacological stress test. In addition, stress EKG at 78% MPHR showed nonspecific ST-T changes in inferior leads. Normal myocardial perfusion. Stress LVEF 61%. Low risk study.  EKG:   EKG 03/22/2020: Normal sinus rhythm at rate of 91 bpm, left atrial enlargement, normal axis.  Nonspecific T abnormality.      Assessment     ICD-10-CM   1. Primary hypertension  I10   2. Hypercholesteremia  E78.00   3. Pre-operative cardiovascular examination, high risk surgery  Z01.810   4. Kidney transplant candidate  Z76.82      There are no discontinued medications.  No orders of the defined types were placed in this encounter.  No orders of the defined types were placed in this encounter.   Recommendations:   Prima Reverie Vaquera is a 50 y.o. African-American female with extensive medical history, pertinent to cardiac include hypertension, hyperlipidemia, diabetes mellitus with diabetic retinopathy and diabetic chronic kidney disease, history of stroke in 2016 with left-sided weakness has mostly resolved, pericardial effusion and history of pericardial window 06/21/2018 referred to me for pretransplant cardiac work-up.  Patient has remained stable with chronic stage IV kidney disease and has been screened at [facility] for kidney transplant.    Patient presents for 4-week follow-up after renal angiography which revealed widely patent bilateral renal arteries, no evidence of renal artery stenosis.  Patient's blood pressure is now well controlled with addition of hydralazine 25 mg 3 times daily.  Encourage patient to continue to monitor her blood pressure on a regular basis.  Patient was originally referred for preoperative evaluation for kidney transplant.  She underwent nuclear stress test which was low risk, carotid artery duplex which showed minimal plaque bilaterally,  echocardiogram with normal LVEF, and renal artery duplex that revealed right renal artery stenosis, however renal angiography revealed widely patent renal arteries bilaterally.  Therefore recommend proceeding with pursuing kidney transplant at [facility]  In regard to cardiovascular risk of surgery, patient understands compared to patients of her same age and sex without medical comorbidities such as hers she is at a relatively higher risk.  However do not feel perioperative ASCVD risk is prohibitive of proceeding with kidney transplant.   Patient is otherwise stable from a cardiovascular standpoint with blood pressure and lipids well controlled.  Follow-up in 1 year, sooner if needed, for hypertension hyperlipidemia.   Alethia Berthold, PA-C 05/30/2020, 5:21 PM Office: [phone]

## 2020-05-30 ENCOUNTER — Other Ambulatory Visit: Payer: Self-pay

## 2020-05-30 ENCOUNTER — Encounter: Payer: Self-pay | Admitting: Student

## 2020-05-30 ENCOUNTER — Ambulatory Visit: Payer: BC Managed Care – PPO | Admitting: Student

## 2020-05-30 VITALS — BP 139/71 | HR 83 | Temp 98.3°F | Ht 66.0 in | Wt 158.0 lb

## 2020-05-30 DIAGNOSIS — E7800 Pure hypercholesterolemia, unspecified: Secondary | ICD-10-CM

## 2020-05-30 DIAGNOSIS — Z7682 Awaiting organ transplant status: Secondary | ICD-10-CM

## 2020-05-30 DIAGNOSIS — I10 Essential (primary) hypertension: Secondary | ICD-10-CM

## 2020-05-30 DIAGNOSIS — Z01810 Encounter for preprocedural cardiovascular examination: Secondary | ICD-10-CM

## 2020-05-31 ENCOUNTER — Other Ambulatory Visit (HOSPITAL_COMMUNITY): Payer: Self-pay | Admitting: *Deleted

## 2020-06-01 ENCOUNTER — Other Ambulatory Visit: Payer: Self-pay

## 2020-06-01 ENCOUNTER — Encounter (HOSPITAL_COMMUNITY)
Admission: RE | Admit: 2020-06-01 | Discharge: 2020-06-01 | Disposition: A | Discharge status: Home / Self Care | Payer: BC Managed Care – PPO | Source: Ambulatory Visit | Attending: Nephrology | Admitting: Nephrology

## 2020-06-01 DIAGNOSIS — D631 Anemia in chronic kidney disease: Secondary | ICD-10-CM | POA: Diagnosis present

## 2020-06-01 MED ORDER — SODIUM CHLORIDE 0.9 % IV SOLN
510.0000 mg | INTRAVENOUS | Status: DC
  Administered 2020-06-01: 510 mg via INTRAVENOUS
  Filled 2020-06-01: qty 17

## 2020-06-01 NOTE — Telephone Encounter (Signed)
From patient.

## 2020-06-01 NOTE — Discharge Instructions (Signed)
Ferumoxytol injection What is this medicine? FERUMOXYTOL is an iron complex. Iron is used to make healthy red blood cells, which carry oxygen and nutrients throughout the body. This medicine is used to treat iron deficiency anemia. This medicine may be used for other purposes; ask your health care provider or pharmacist if you have questions. COMMON BRAND NAME(S): Feraheme What should I tell my health care provider before I take this medicine? They need to know if you have any of these conditions:  anemia not caused by low iron levels  high levels of iron in the blood  magnetic resonance imaging (MRI) test scheduled  an unusual or allergic reaction to iron, other medicines, foods, dyes, or preservatives  pregnant or trying to get pregnant  breast-feeding How should I use this medicine? This medicine is for injection into a vein. It is given by a health care professional in a hospital or clinic setting. Talk to your pediatrician regarding the use of this medicine in children. Special care may be needed. Overdosage: If you think you have taken too much of this medicine contact a poison control center or emergency room at once. NOTE: This medicine is only for you. Do not share this medicine with others. What if I miss a dose? It is important not to miss your dose. Call your doctor or health care professional if you are unable to keep an appointment. What may interact with this medicine? This medicine may interact with the following medications:  other iron products This list may not describe all possible interactions. Give your health care provider a list of all the medicines, herbs, non-prescription drugs, or dietary supplements you use. Also tell them if you smoke, drink alcohol, or use illegal drugs. Some items may interact with your medicine. What should I watch for while using this medicine? Visit your doctor or healthcare professional regularly. Tell your doctor or healthcare  professional if your symptoms do not start to get better or if they get worse. You may need blood work done while you are taking this medicine. You may need to follow a special diet. Talk to your doctor. Foods that contain iron include: whole grains/cereals, dried fruits, beans, or peas, leafy green vegetables, and organ meats (liver, kidney). What side effects may I notice from receiving this medicine? Side effects that you should report to your doctor or health care professional as soon as possible:  allergic reactions like skin rash, itching or hives, swelling of the face, lips, or tongue  breathing problems  changes in blood pressure  feeling faint or lightheaded, falls  fever or chills  flushing, sweating, or hot feelings  swelling of the ankles or feet Side effects that usually do not require medical attention (report to your doctor or health care professional if they continue or are bothersome):  diarrhea  headache  nausea, vomiting  stomach pain This list may not describe all possible side effects. Call your doctor for medical advice about side effects. You may report side effects to FDA at 1-800-FDA-1088. Where should I keep my medicine? This drug is given in a hospital or clinic and will not be stored at home. NOTE: This sheet is a summary. It may not cover all possible information. If you have questions about this medicine, talk to your doctor, pharmacist, or health care provider.  2021 Elsevier/Gold Standard (2016-03-31 20:21:10)  

## 2020-06-04 NOTE — Telephone Encounter (Signed)
From patient.

## 2020-06-04 NOTE — Telephone Encounter (Signed)
Can you please set up office visit. Preferably at 9:30 tomorrow. I will not need full hour with 9:00 pt.

## 2020-06-08 ENCOUNTER — Encounter (HOSPITAL_COMMUNITY)
Admission: RE | Admit: 2020-06-08 | Discharge: 2020-06-08 | Disposition: A | Discharge status: Home / Self Care | Payer: BC Managed Care – PPO | Source: Ambulatory Visit | Attending: Nephrology | Admitting: Nephrology

## 2020-06-08 ENCOUNTER — Other Ambulatory Visit: Payer: Self-pay

## 2020-06-08 DIAGNOSIS — D631 Anemia in chronic kidney disease: Secondary | ICD-10-CM | POA: Diagnosis not present

## 2020-06-08 MED ORDER — SODIUM CHLORIDE 0.9 % IV SOLN
510.0000 mg | INTRAVENOUS | Status: DC
  Administered 2020-06-08: 510 mg via INTRAVENOUS
  Filled 2020-06-08: qty 510

## 2020-06-15 ENCOUNTER — Ambulatory Visit: Payer: BC Managed Care – PPO | Admitting: Student

## 2020-06-18 ENCOUNTER — Ambulatory Visit: Payer: BC Managed Care – PPO | Admitting: Student

## 2020-06-26 ENCOUNTER — Other Ambulatory Visit: Payer: Self-pay

## 2020-06-26 ENCOUNTER — Encounter (HOSPITAL_COMMUNITY)
Admission: RE | Admit: 2020-06-26 | Discharge: 2020-06-26 | Disposition: A | Discharge status: Home / Self Care | Payer: BC Managed Care – PPO | Source: Ambulatory Visit | Attending: Nephrology | Admitting: Nephrology

## 2020-06-26 VITALS — BP 109/52 | HR 71

## 2020-06-26 DIAGNOSIS — N289 Disorder of kidney and ureter, unspecified: Secondary | ICD-10-CM | POA: Diagnosis present

## 2020-06-26 LAB — RENAL FUNCTION PANEL
Albumin: 3.9 g/dL (ref 3.5–5.0)
Anion gap: 12 (ref 5–15)
BUN: 101 mg/dL — ABNORMAL HIGH (ref 6–20)
CO2: 27 mmol/L (ref 22–32)
Calcium: 8.5 mg/dL — ABNORMAL LOW (ref 8.9–10.3)
Chloride: 96 mmol/L — ABNORMAL LOW (ref 98–111)
Creatinine, Ser: 4.33 mg/dL — ABNORMAL HIGH (ref 0.44–1.00)
GFR, Estimated: 12 mL/min — ABNORMAL LOW (ref >60)
Glucose, Bld: 158 mg/dL — ABNORMAL HIGH (ref 70–99)
Phosphorus: 5.5 mg/dL — ABNORMAL HIGH (ref 2.5–4.6)
Potassium: 4.9 mmol/L (ref 3.5–5.1)
Sodium: 135 mmol/L (ref 135–145)

## 2020-06-26 LAB — IRON AND TIBC
Iron: 87 ug/dL (ref 28–170)
Saturation Ratios: 32 % — ABNORMAL HIGH (ref 10.4–31.8)
TIBC: 276 ug/dL (ref 250–450)
UIBC: 189 ug/dL

## 2020-06-26 LAB — FERRITIN: Ferritin: 1040 ng/mL — ABNORMAL HIGH (ref 11–307)

## 2020-06-26 LAB — POCT HEMOGLOBIN-HEMACUE: Hemoglobin: 9.4 g/dL — ABNORMAL LOW (ref 12.0–15.0)

## 2020-06-26 MED ORDER — EPOETIN ALFA-EPBX 10000 UNIT/ML IJ SOLN
20000.0000 [IU] | INTRAMUSCULAR | Status: DC
  Administered 2020-06-26: 20000 [IU] via SUBCUTANEOUS

## 2020-06-26 MED ORDER — EPOETIN ALFA-EPBX 10000 UNIT/ML IJ SOLN
INTRAMUSCULAR | Status: AC
  Filled 2020-06-26: qty 2

## 2020-07-02 ENCOUNTER — Encounter: Payer: Self-pay | Admitting: Cardiology

## 2020-07-24 ENCOUNTER — Encounter (HOSPITAL_COMMUNITY): Payer: BC Managed Care – PPO

## 2020-07-25 ENCOUNTER — Encounter (HOSPITAL_COMMUNITY)
Admission: RE | Admit: 2020-07-25 | Discharge: 2020-07-25 | Disposition: A | Discharge status: Home / Self Care | Payer: BC Managed Care – PPO | Source: Ambulatory Visit | Attending: Nephrology | Admitting: Nephrology

## 2020-07-25 ENCOUNTER — Other Ambulatory Visit: Payer: Self-pay

## 2020-07-25 VITALS — BP 180/78 | HR 75 | Temp 97.7°F | Resp 18

## 2020-07-25 DIAGNOSIS — N289 Disorder of kidney and ureter, unspecified: Secondary | ICD-10-CM | POA: Insufficient documentation

## 2020-07-25 LAB — RENAL FUNCTION PANEL
Albumin: 3.9 g/dL (ref 3.5–5.0)
Anion gap: 10 (ref 5–15)
BUN: 86 mg/dL — ABNORMAL HIGH (ref 6–20)
CO2: 27 mmol/L (ref 22–32)
Calcium: 8.5 mg/dL — ABNORMAL LOW (ref 8.9–10.3)
Chloride: 99 mmol/L (ref 98–111)
Creatinine, Ser: 4.44 mg/dL — ABNORMAL HIGH (ref 0.44–1.00)
GFR, Estimated: 11 mL/min — ABNORMAL LOW (ref >60)
Glucose, Bld: 197 mg/dL — ABNORMAL HIGH (ref 70–99)
Phosphorus: 6.7 mg/dL — ABNORMAL HIGH (ref 2.5–4.6)
Potassium: 4.4 mmol/L (ref 3.5–5.1)
Sodium: 136 mmol/L (ref 135–145)

## 2020-07-25 LAB — IRON AND TIBC
Iron: 77 ug/dL (ref 28–170)
Saturation Ratios: 29 % (ref 10.4–31.8)
TIBC: 262 ug/dL (ref 250–450)
UIBC: 185 ug/dL

## 2020-07-25 LAB — FERRITIN: Ferritin: 697 ng/mL — ABNORMAL HIGH (ref 11–307)

## 2020-07-25 LAB — POCT HEMOGLOBIN-HEMACUE: Hemoglobin: 9.5 g/dL — ABNORMAL LOW (ref 12.0–15.0)

## 2020-07-25 MED ORDER — EPOETIN ALFA-EPBX 10000 UNIT/ML IJ SOLN: 20000.0000 [IU] | INTRAMUSCULAR | Status: DC

## 2020-07-25 MED ORDER — EPOETIN ALFA-EPBX 10000 UNIT/ML IJ SOLN
INTRAMUSCULAR | Status: AC
  Administered 2020-07-25: 20000 [IU] via SUBCUTANEOUS
  Filled 2020-07-25: qty 2

## 2020-08-21 ENCOUNTER — Encounter (HOSPITAL_COMMUNITY)
Admission: RE | Admit: 2020-08-21 | Discharge: 2020-08-21 | Disposition: A | Discharge status: Home / Self Care | Payer: BC Managed Care – PPO | Source: Ambulatory Visit | Attending: Nephrology | Admitting: Nephrology

## 2020-08-21 VITALS — BP 144/75 | HR 67 | Temp 97.3°F | Resp 18

## 2020-08-21 DIAGNOSIS — N289 Disorder of kidney and ureter, unspecified: Secondary | ICD-10-CM | POA: Diagnosis not present

## 2020-08-21 LAB — POCT HEMOGLOBIN-HEMACUE: Hemoglobin: 10.2 g/dL — ABNORMAL LOW (ref 12.0–15.0)

## 2020-08-21 MED ORDER — EPOETIN ALFA-EPBX 10000 UNIT/ML IJ SOLN
INTRAMUSCULAR | Status: AC
  Filled 2020-08-21: qty 1

## 2020-08-21 MED ORDER — EPOETIN ALFA-EPBX 10000 UNIT/ML IJ SOLN
20000.0000 [IU] | INTRAMUSCULAR | Status: DC
  Administered 2020-08-21: 20000 [IU] via SUBCUTANEOUS

## 2020-09-18 ENCOUNTER — Ambulatory Visit (HOSPITAL_COMMUNITY)
Admission: RE | Admit: 2020-09-18 | Discharge: 2020-09-18 | Disposition: A | Discharge status: Home / Self Care | Payer: BC Managed Care – PPO | Source: Ambulatory Visit | Attending: Nephrology | Admitting: Nephrology

## 2020-09-18 ENCOUNTER — Other Ambulatory Visit: Payer: Self-pay

## 2020-09-18 VITALS — BP 139/69 | HR 65 | Temp 97.3°F | Resp 18

## 2020-09-18 DIAGNOSIS — N289 Disorder of kidney and ureter, unspecified: Secondary | ICD-10-CM | POA: Diagnosis present

## 2020-09-18 LAB — FERRITIN: Ferritin: 600 ng/mL — ABNORMAL HIGH (ref 11–307)

## 2020-09-18 LAB — RENAL FUNCTION PANEL
Albumin: 3.7 g/dL (ref 3.5–5.0)
Anion gap: 10 (ref 5–15)
BUN: 91 mg/dL — ABNORMAL HIGH (ref 6–20)
CO2: 27 mmol/L (ref 22–32)
Calcium: 8.5 mg/dL — ABNORMAL LOW (ref 8.9–10.3)
Chloride: 101 mmol/L (ref 98–111)
Creatinine, Ser: 3.95 mg/dL — ABNORMAL HIGH (ref 0.44–1.00)
GFR, Estimated: 13 mL/min — ABNORMAL LOW (ref >60)
Glucose, Bld: 151 mg/dL — ABNORMAL HIGH (ref 70–99)
Phosphorus: 6.1 mg/dL — ABNORMAL HIGH (ref 2.5–4.6)
Potassium: 4.2 mmol/L (ref 3.5–5.1)
Sodium: 138 mmol/L (ref 135–145)

## 2020-09-18 LAB — POCT HEMOGLOBIN-HEMACUE: Hemoglobin: 10.6 g/dL — ABNORMAL LOW (ref 12.0–15.0)

## 2020-09-18 LAB — IRON AND TIBC
Iron: 76 ug/dL (ref 28–170)
Saturation Ratios: 30 % (ref 10.4–31.8)
TIBC: 249 ug/dL — ABNORMAL LOW (ref 250–450)
UIBC: 173 ug/dL

## 2020-09-18 MED ORDER — EPOETIN ALFA-EPBX 10000 UNIT/ML IJ SOLN
20000.0000 [IU] | INTRAMUSCULAR | Status: DC
  Administered 2020-09-18: 20000 [IU] via SUBCUTANEOUS

## 2020-09-18 MED ORDER — EPOETIN ALFA-EPBX 10000 UNIT/ML IJ SOLN
INTRAMUSCULAR | Status: AC
  Filled 2020-09-18: qty 2

## 2020-10-16 ENCOUNTER — Ambulatory Visit (HOSPITAL_COMMUNITY)
Admission: RE | Admit: 2020-10-16 | Discharge: 2020-10-16 | Disposition: A | Discharge status: Home / Self Care | Payer: BC Managed Care – PPO | Source: Ambulatory Visit | Attending: Nephrology | Admitting: Nephrology

## 2020-10-16 VITALS — BP 137/62 | HR 64 | Temp 98.6°F

## 2020-10-16 DIAGNOSIS — N289 Disorder of kidney and ureter, unspecified: Secondary | ICD-10-CM | POA: Diagnosis present

## 2020-10-16 LAB — IRON AND TIBC
Iron: 81 ug/dL (ref 28–170)
Saturation Ratios: 30 % (ref 10.4–31.8)
TIBC: 272 ug/dL (ref 250–450)
UIBC: 191 ug/dL

## 2020-10-16 LAB — POCT HEMOGLOBIN-HEMACUE: Hemoglobin: 10.1 g/dL — ABNORMAL LOW (ref 12.0–15.0)

## 2020-10-16 LAB — RENAL FUNCTION PANEL
Albumin: 4 g/dL (ref 3.5–5.0)
Anion gap: 8 (ref 5–15)
BUN: 95 mg/dL — ABNORMAL HIGH (ref 6–20)
CO2: 27 mmol/L (ref 22–32)
Calcium: 8.5 mg/dL — ABNORMAL LOW (ref 8.9–10.3)
Chloride: 101 mmol/L (ref 98–111)
Creatinine, Ser: 4.34 mg/dL — ABNORMAL HIGH (ref 0.44–1.00)
GFR, Estimated: 12 mL/min — ABNORMAL LOW (ref >60)
Glucose, Bld: 247 mg/dL — ABNORMAL HIGH (ref 70–99)
Phosphorus: 5.9 mg/dL — ABNORMAL HIGH (ref 2.5–4.6)
Potassium: 4.7 mmol/L (ref 3.5–5.1)
Sodium: 136 mmol/L (ref 135–145)

## 2020-10-16 LAB — FERRITIN: Ferritin: 554 ng/mL — ABNORMAL HIGH (ref 11–307)

## 2020-10-16 MED ORDER — EPOETIN ALFA-EPBX 10000 UNIT/ML IJ SOLN
INTRAMUSCULAR | Status: AC
  Administered 2020-10-16: 20000 [IU] via SUBCUTANEOUS
  Filled 2020-10-16: qty 2

## 2020-10-16 MED ORDER — EPOETIN ALFA-EPBX 10000 UNIT/ML IJ SOLN: 20000.0000 [IU] | INTRAMUSCULAR | Status: DC

## 2020-11-13 ENCOUNTER — Other Ambulatory Visit: Payer: Self-pay

## 2020-11-13 ENCOUNTER — Ambulatory Visit (HOSPITAL_COMMUNITY)
Admission: RE | Admit: 2020-11-13 | Discharge: 2020-11-13 | Disposition: A | Discharge status: Home / Self Care | Payer: BC Managed Care – PPO | Source: Ambulatory Visit | Attending: Nephrology | Admitting: Nephrology

## 2020-11-13 VITALS — BP 119/75 | HR 63 | Temp 98.2°F | Resp 18

## 2020-11-13 DIAGNOSIS — E119 Type 2 diabetes mellitus without complications: Secondary | ICD-10-CM | POA: Insufficient documentation

## 2020-11-13 DIAGNOSIS — N289 Disorder of kidney and ureter, unspecified: Secondary | ICD-10-CM | POA: Diagnosis not present

## 2020-11-13 LAB — IRON AND TIBC
Iron: 89 ug/dL (ref 28–170)
Saturation Ratios: 30 % (ref 10.4–31.8)
TIBC: 301 ug/dL (ref 250–450)
UIBC: 212 ug/dL

## 2020-11-13 LAB — RENAL FUNCTION PANEL
Albumin: 4.3 g/dL (ref 3.5–5.0)
Anion gap: 14 (ref 5–15)
BUN: 108 mg/dL — ABNORMAL HIGH (ref 6–20)
CO2: 28 mmol/L (ref 22–32)
Calcium: 8.8 mg/dL — ABNORMAL LOW (ref 8.9–10.3)
Chloride: 94 mmol/L — ABNORMAL LOW (ref 98–111)
Creatinine, Ser: 4.38 mg/dL — ABNORMAL HIGH (ref 0.44–1.00)
GFR, Estimated: 12 mL/min — ABNORMAL LOW (ref >60)
Glucose, Bld: 63 mg/dL — ABNORMAL LOW (ref 70–99)
Phosphorus: 6.5 mg/dL — ABNORMAL HIGH (ref 2.5–4.6)
Potassium: 3.7 mmol/L (ref 3.5–5.1)
Sodium: 136 mmol/L (ref 135–145)

## 2020-11-13 LAB — POCT HEMOGLOBIN-HEMACUE: Hemoglobin: 11.1 g/dL — ABNORMAL LOW (ref 12.0–15.0)

## 2020-11-13 LAB — HEMOGLOBIN A1C
Hgb A1c MFr Bld: 7.9 % — ABNORMAL HIGH (ref 4.8–5.6)
Mean Plasma Glucose: 180.03 mg/dL

## 2020-11-13 LAB — FERRITIN: Ferritin: 668 ng/mL — ABNORMAL HIGH (ref 11–307)

## 2020-11-13 MED ORDER — EPOETIN ALFA-EPBX 10000 UNIT/ML IJ SOLN
20000.0000 [IU] | INTRAMUSCULAR | Status: DC
  Administered 2020-11-13: 20000 [IU] via SUBCUTANEOUS

## 2020-11-13 MED ORDER — EPOETIN ALFA-EPBX 10000 UNIT/ML IJ SOLN
INTRAMUSCULAR | Status: AC
  Filled 2020-11-13: qty 2

## 2020-12-11 ENCOUNTER — Inpatient Hospital Stay (HOSPITAL_COMMUNITY): Admission: RE | Admit: 2020-12-11 | Payer: BC Managed Care – PPO | Source: Ambulatory Visit

## 2020-12-18 ENCOUNTER — Ambulatory Visit (HOSPITAL_COMMUNITY)
Admission: RE | Admit: 2020-12-18 | Discharge: 2020-12-18 | Disposition: A | Discharge status: Home / Self Care | Payer: BC Managed Care – PPO | Source: Ambulatory Visit | Attending: Nephrology | Admitting: Nephrology

## 2020-12-18 ENCOUNTER — Other Ambulatory Visit: Payer: Self-pay

## 2020-12-18 VITALS — BP 122/62 | HR 65 | Resp 18

## 2020-12-18 DIAGNOSIS — N289 Disorder of kidney and ureter, unspecified: Secondary | ICD-10-CM | POA: Diagnosis not present

## 2020-12-18 LAB — RENAL FUNCTION PANEL
Albumin: 4 g/dL (ref 3.5–5.0)
Anion gap: 11 (ref 5–15)
BUN: 116 mg/dL — ABNORMAL HIGH (ref 6–20)
CO2: 29 mmol/L (ref 22–32)
Calcium: 8.4 mg/dL — ABNORMAL LOW (ref 8.9–10.3)
Chloride: 93 mmol/L — ABNORMAL LOW (ref 98–111)
Creatinine, Ser: 4.91 mg/dL — ABNORMAL HIGH (ref 0.44–1.00)
GFR, Estimated: 10 mL/min — ABNORMAL LOW (ref >60)
Glucose, Bld: 201 mg/dL — ABNORMAL HIGH (ref 70–99)
Phosphorus: 6.9 mg/dL — ABNORMAL HIGH (ref 2.5–4.6)
Potassium: 3.5 mmol/L (ref 3.5–5.1)
Sodium: 133 mmol/L — ABNORMAL LOW (ref 135–145)

## 2020-12-18 LAB — IRON AND TIBC
Iron: 88 ug/dL (ref 28–170)
Saturation Ratios: 33 % — ABNORMAL HIGH (ref 10.4–31.8)
TIBC: 266 ug/dL (ref 250–450)
UIBC: 178 ug/dL

## 2020-12-18 LAB — FERRITIN: Ferritin: 573 ng/mL — ABNORMAL HIGH (ref 11–307)

## 2020-12-18 LAB — POCT HEMOGLOBIN-HEMACUE: Hemoglobin: 10.7 g/dL — ABNORMAL LOW (ref 12.0–15.0)

## 2020-12-18 MED ORDER — EPOETIN ALFA-EPBX 10000 UNIT/ML IJ SOLN
20000.0000 [IU] | INTRAMUSCULAR | Status: DC
  Administered 2020-12-18: 20000 [IU] via SUBCUTANEOUS

## 2020-12-18 MED ORDER — EPOETIN ALFA-EPBX 10000 UNIT/ML IJ SOLN
INTRAMUSCULAR | Status: AC
  Filled 2020-12-18: qty 2

## 2020-12-19 LAB — PTH, INTACT AND CALCIUM
Calcium, Total (PTH): 8.7 mg/dL (ref 8.7–10.2)
PTH: 156 pg/mL — ABNORMAL HIGH (ref 15–65)

## 2021-01-07 ENCOUNTER — Encounter: Payer: Self-pay | Admitting: Nephrology

## 2021-01-07 DIAGNOSIS — D631 Anemia in chronic kidney disease: Secondary | ICD-10-CM | POA: Insufficient documentation

## 2021-01-07 DIAGNOSIS — E1110 Type 2 diabetes mellitus with ketoacidosis without coma: Secondary | ICD-10-CM | POA: Insufficient documentation

## 2021-01-07 DIAGNOSIS — D509 Iron deficiency anemia, unspecified: Secondary | ICD-10-CM | POA: Insufficient documentation

## 2021-01-08 ENCOUNTER — Encounter (HOSPITAL_COMMUNITY): Payer: BC Managed Care – PPO

## 2021-01-15 ENCOUNTER — Encounter (HOSPITAL_COMMUNITY)
Admission: RE | Admit: 2021-01-15 | Discharge: 2021-01-15 | Disposition: A | Discharge status: Home / Self Care | Payer: BC Managed Care – PPO | Source: Ambulatory Visit | Attending: Nephrology | Admitting: Nephrology

## 2021-01-15 ENCOUNTER — Other Ambulatory Visit: Payer: Self-pay

## 2021-01-15 ENCOUNTER — Encounter (HOSPITAL_COMMUNITY): Payer: BC Managed Care – PPO

## 2021-01-15 VITALS — BP 118/66 | HR 68 | Temp 97.3°F | Resp 18

## 2021-01-15 DIAGNOSIS — N289 Disorder of kidney and ureter, unspecified: Secondary | ICD-10-CM | POA: Diagnosis present

## 2021-01-15 LAB — RENAL FUNCTION PANEL
Albumin: 4.1 g/dL (ref 3.5–5.0)
Anion gap: 13 (ref 5–15)
BUN: 102 mg/dL — ABNORMAL HIGH (ref 6–20)
CO2: 28 mmol/L (ref 22–32)
Calcium: 8.7 mg/dL — ABNORMAL LOW (ref 8.9–10.3)
Chloride: 94 mmol/L — ABNORMAL LOW (ref 98–111)
Creatinine, Ser: 4.59 mg/dL — ABNORMAL HIGH (ref 0.44–1.00)
GFR, Estimated: 11 mL/min — ABNORMAL LOW (ref >60)
Glucose, Bld: 141 mg/dL — ABNORMAL HIGH (ref 70–99)
Phosphorus: 6.9 mg/dL — ABNORMAL HIGH (ref 2.5–4.6)
Potassium: 3.9 mmol/L (ref 3.5–5.1)
Sodium: 135 mmol/L (ref 135–145)

## 2021-01-15 LAB — IRON AND TIBC
Iron: 66 ug/dL (ref 28–170)
Saturation Ratios: 23 % (ref 10.4–31.8)
TIBC: 281 ug/dL (ref 250–450)
UIBC: 215 ug/dL

## 2021-01-15 LAB — POCT HEMOGLOBIN-HEMACUE: Hemoglobin: 10.6 g/dL — ABNORMAL LOW (ref 12.0–15.0)

## 2021-01-15 LAB — FERRITIN: Ferritin: 590 ng/mL — ABNORMAL HIGH (ref 11–307)

## 2021-01-15 MED ORDER — EPOETIN ALFA-EPBX 10000 UNIT/ML IJ SOLN
20000.0000 [IU] | INTRAMUSCULAR | Status: DC
  Administered 2021-01-15: 20000 [IU] via SUBCUTANEOUS

## 2021-01-15 MED ORDER — EPOETIN ALFA-EPBX 10000 UNIT/ML IJ SOLN
INTRAMUSCULAR | Status: AC
  Filled 2021-01-15: qty 2

## 2021-01-16 LAB — PTH, INTACT AND CALCIUM
Calcium, Total (PTH): 8.9 mg/dL (ref 8.7–10.2)
PTH: 227 pg/mL — ABNORMAL HIGH (ref 15–65)

## 2021-02-12 ENCOUNTER — Encounter (HOSPITAL_COMMUNITY): Payer: BC Managed Care – PPO

## 2021-05-30 ENCOUNTER — Ambulatory Visit: Payer: BC Managed Care – PPO | Admitting: Student

## 2022-08-19 IMAGING — US US RENAL
1 series · 13 of 25 positions shown · non-contrast
Comparison: CT 11/17/2019

CLINICAL DATA: Acute kidney injury, none urolithiasis, prior
cystoscopy and stent placement

EXAM:
RENAL / URINARY TRACT ULTRASOUND COMPLETE

[Series 1: us renal · 13 of 59 slices shown]
[im 1/59]
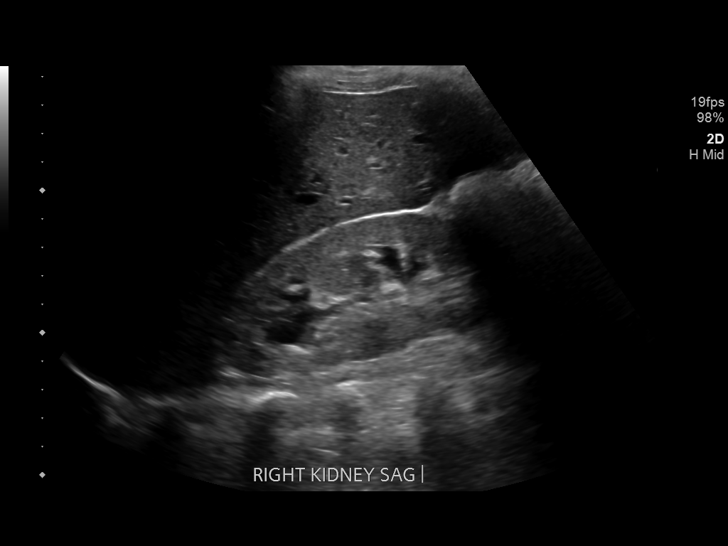
[im 5/59]
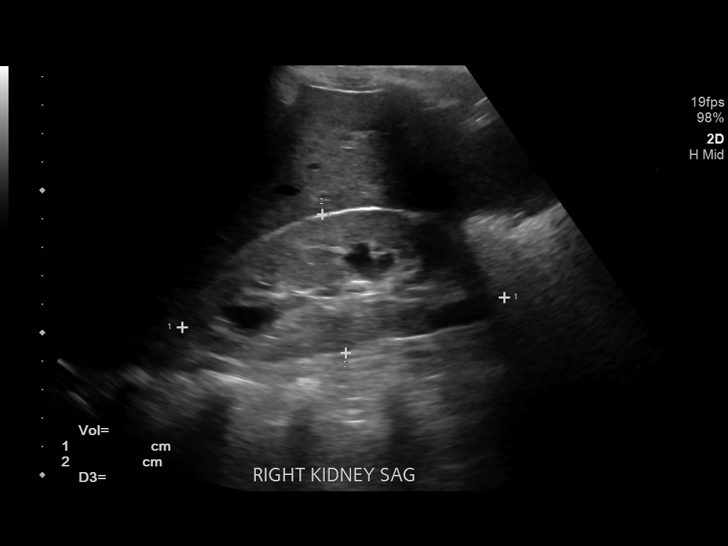
[im 10/59]
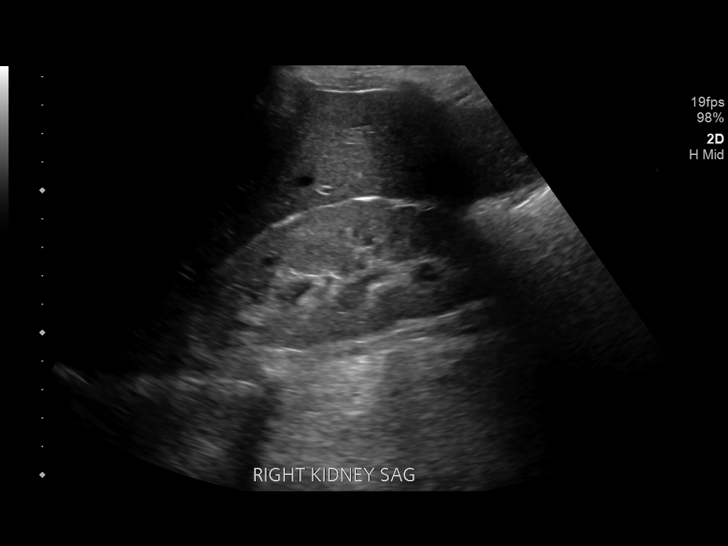
[im 15/59]
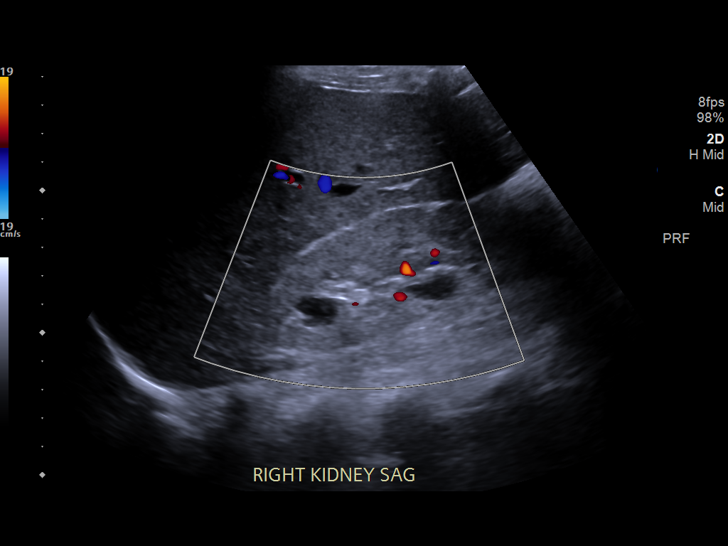
[im 20/59]
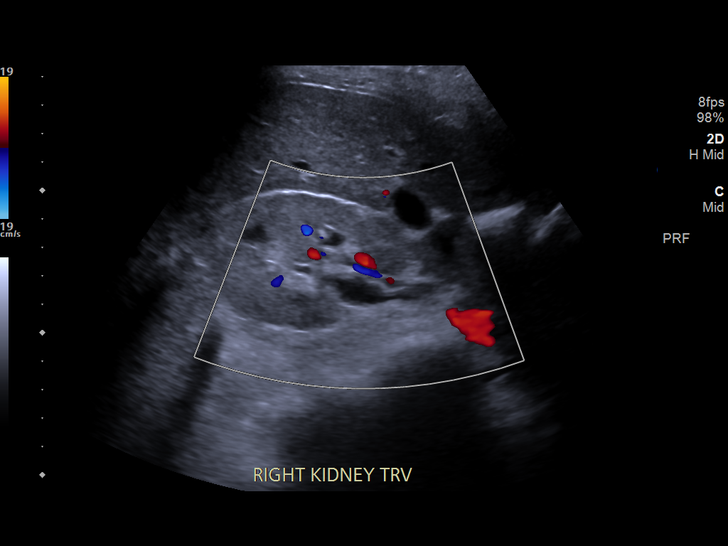
[im 25/59]
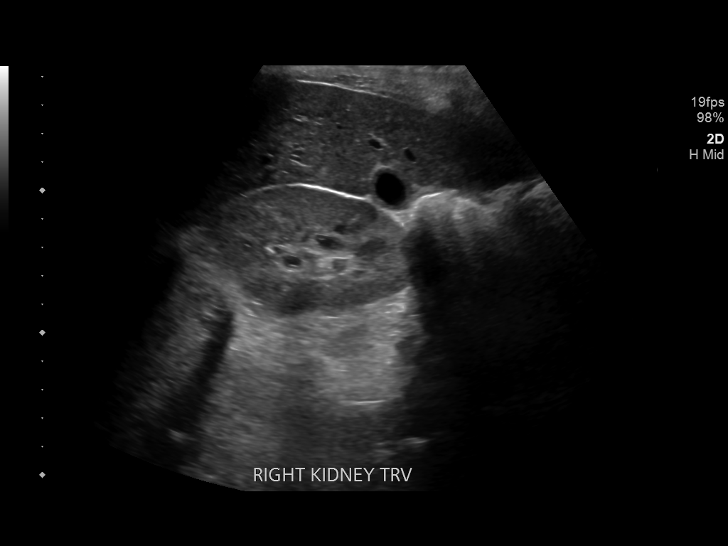
[im 30/59]
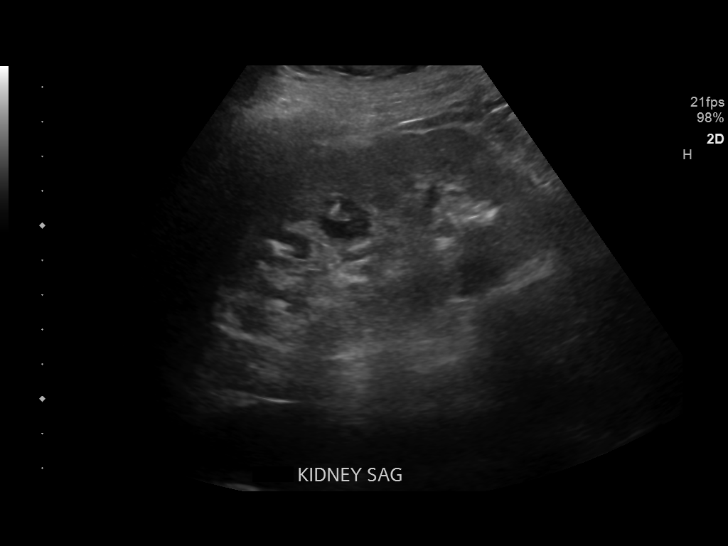
[im 34/59]
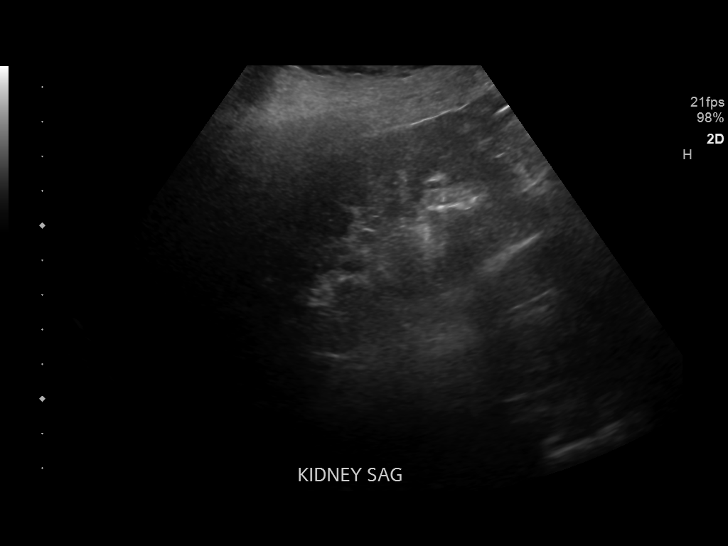
[im 39/59]
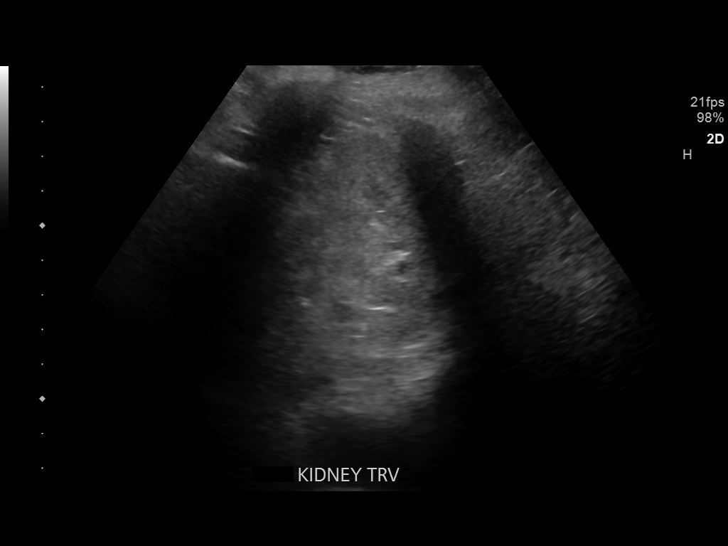
[im 44/59]
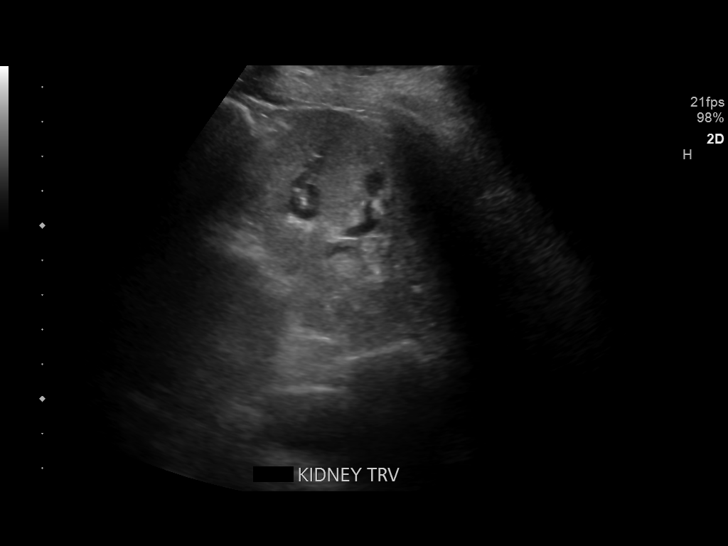
[im 49/59]
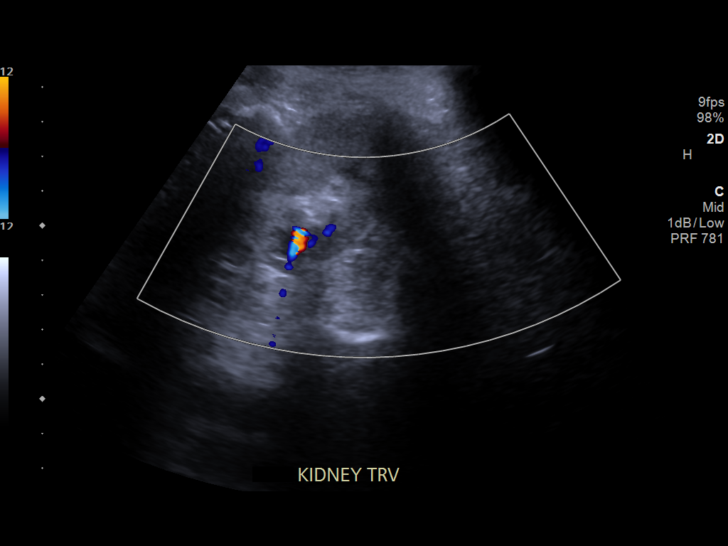
[im 54/59]
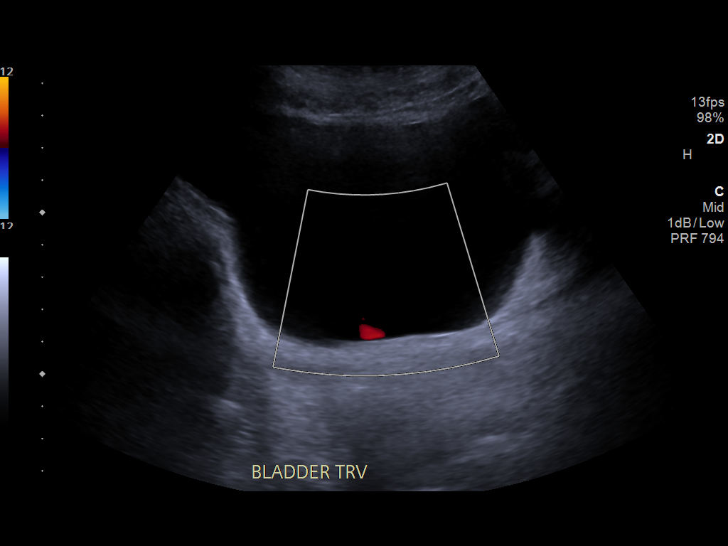
[im 59/59]
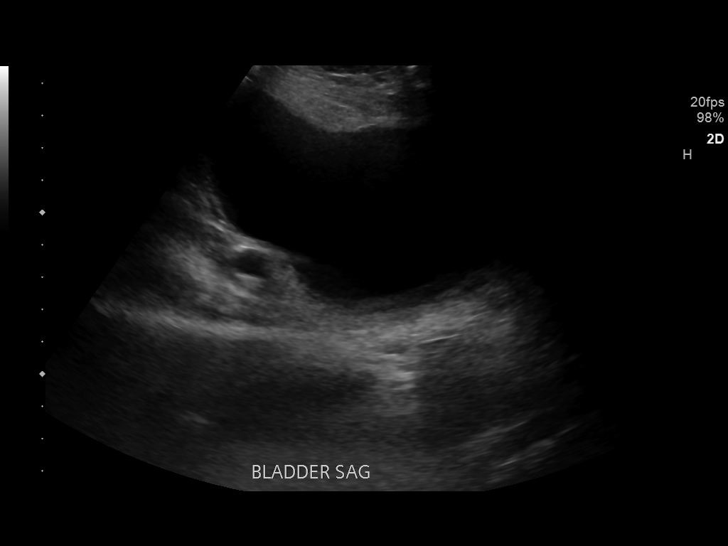

[13 of 25 positions shown; findings below may reference images not displayed]

FINDINGS: Right Kidney:

Renal measurements: 11.4 x 4.9 x 5.1 cm = volume: 150.9 mL.
Diffusely increased renal cortical echogenicity. Mild
hydronephrosis. Multiple echogenic, shadowing calculi measuring up
to 4.6 mm in diameter seen within the interpolar and lower pole
right kidney. No concerning renal mass.

Left Kidney:

Renal measurements: 10.3 x 5.4 x 6.3 cm = volume: 182 mL. Diffusely
increased renal cortical echogenicity. No hydronephrosis or
concerning renal mass. Multiple shadowing calculi, largest in the
lower pole measuring up to 9.9 mm.

Bladder:

Bilateral bladder jets identified. Urinary bladder unremarkable for
degree of distension.

Other:

None.
IMPRESSION: 1. Mild right hydronephrosis though visualization of the right
bladder jet argues against a complete obstruction.
2. Multiple bilateral shadowing calculi measuring up to 4.6 mm on
the right and 9.9 mm on the left.
3. Increased renal cortical echogenicity bilaterally, nonspecific
but can be seen in the setting of medical renal disease.

## 2022-09-26 ENCOUNTER — Other Ambulatory Visit: Payer: Self-pay | Admitting: Family Medicine

## 2022-09-26 ENCOUNTER — Encounter (HOSPITAL_COMMUNITY): Payer: Self-pay

## 2022-09-26 DIAGNOSIS — Z1231 Encounter for screening mammogram for malignant neoplasm of breast: Secondary | ICD-10-CM

## 2022-10-01 ENCOUNTER — Other Ambulatory Visit: Payer: Self-pay | Admitting: *Deleted

## 2022-10-01 ENCOUNTER — Inpatient Hospital Stay
Admission: RE | Admit: 2022-10-01 | Discharge: 2022-10-01 | Disposition: A | Discharge status: Home / Self Care | Payer: Self-pay | Source: Ambulatory Visit | Attending: Family Medicine | Admitting: Family Medicine

## 2022-10-01 DIAGNOSIS — Z1231 Encounter for screening mammogram for malignant neoplasm of breast: Secondary | ICD-10-CM

## 2022-10-03 ENCOUNTER — Ambulatory Visit
Admission: RE | Admit: 2022-10-03 | Discharge: 2022-10-03 | Disposition: A | Discharge status: Home / Self Care | Payer: Medicare HMO | Source: Ambulatory Visit | Attending: Family Medicine | Admitting: Family Medicine

## 2022-10-03 ENCOUNTER — Encounter (HOSPITAL_COMMUNITY): Payer: Self-pay

## 2022-10-03 DIAGNOSIS — Z1231 Encounter for screening mammogram for malignant neoplasm of breast: Secondary | ICD-10-CM
